# Patient Record
Sex: Female | Born: 1965 | Hispanic: No | Marital: Married | Smoking: Never smoker
Health system: Southern US, Community
[De-identification: ages and names within clinical notes are randomized; demographics above are authoritative.]

## PROBLEM LIST (undated history)

## (undated) DIAGNOSIS — Z1379 Encounter for other screening for genetic and chromosomal anomalies: Secondary | ICD-10-CM

## (undated) DIAGNOSIS — J45909 Unspecified asthma, uncomplicated: Secondary | ICD-10-CM

## (undated) DIAGNOSIS — T7840XA Allergy, unspecified, initial encounter: Secondary | ICD-10-CM

## (undated) DIAGNOSIS — Z803 Family history of malignant neoplasm of breast: Secondary | ICD-10-CM

## (undated) DIAGNOSIS — M1990 Unspecified osteoarthritis, unspecified site: Secondary | ICD-10-CM

## (undated) HISTORY — DX: Encounter for other screening for genetic and chromosomal anomalies: Z13.79

## (undated) HISTORY — PX: BREAST BIOPSY: SHX20

## (undated) HISTORY — DX: Unspecified osteoarthritis, unspecified site: M19.90

## (undated) HISTORY — DX: Allergy, unspecified, initial encounter: T78.40XA

## (undated) HISTORY — DX: Family history of malignant neoplasm of breast: Z80.3

## (undated) HISTORY — PX: TUBAL LIGATION: SHX77

## (undated) HISTORY — DX: Unspecified asthma, uncomplicated: J45.909

---

## 2005-11-15 HISTORY — PX: ABLATION: SHX5711

## 2017-09-05 DIAGNOSIS — Z9889 Other specified postprocedural states: Secondary | ICD-10-CM | POA: Insufficient documentation

## 2020-04-15 DIAGNOSIS — M79605 Pain in left leg: Secondary | ICD-10-CM | POA: Insufficient documentation

## 2020-04-15 DIAGNOSIS — G8929 Other chronic pain: Secondary | ICD-10-CM | POA: Insufficient documentation

## 2020-10-21 ENCOUNTER — Ambulatory Visit (INDEPENDENT_AMBULATORY_CARE_PROVIDER_SITE_OTHER): Payer: BLUE CROSS/BLUE SHIELD | Admitting: Advanced Practice Midwife

## 2020-10-21 ENCOUNTER — Encounter: Payer: Self-pay | Admitting: Advanced Practice Midwife

## 2020-10-21 ENCOUNTER — Other Ambulatory Visit: Payer: Self-pay

## 2020-10-21 ENCOUNTER — Other Ambulatory Visit (HOSPITAL_COMMUNITY)
Admission: RE | Admit: 2020-10-21 | Discharge: 2020-10-21 | Disposition: A | Discharge status: Home / Self Care | Payer: BLUE CROSS/BLUE SHIELD | Source: Ambulatory Visit | Attending: Advanced Practice Midwife | Admitting: Advanced Practice Midwife

## 2020-10-21 VITALS — BP 128/72 | Ht 68.0 in | Wt 171.2 lb

## 2020-10-21 DIAGNOSIS — Z01419 Encounter for gynecological examination (general) (routine) without abnormal findings: Secondary | ICD-10-CM

## 2020-10-21 DIAGNOSIS — Z0000 Encounter for general adult medical examination without abnormal findings: Secondary | ICD-10-CM

## 2020-10-21 DIAGNOSIS — Z1239 Encounter for other screening for malignant neoplasm of breast: Secondary | ICD-10-CM

## 2020-10-21 DIAGNOSIS — Z124 Encounter for screening for malignant neoplasm of cervix: Secondary | ICD-10-CM | POA: Insufficient documentation

## 2020-10-21 DIAGNOSIS — Z13220 Encounter for screening for lipoid disorders: Secondary | ICD-10-CM | POA: Diagnosis not present

## 2020-10-21 DIAGNOSIS — Z131 Encounter for screening for diabetes mellitus: Secondary | ICD-10-CM

## 2020-10-21 NOTE — Progress Notes (Signed)
Gynecology Annual Exam  PCP: Vickie [name], No Pcp Per  Chief Complaint:  Chief Complaint  Vickie [name] presents with  . Gynecologic Exam    History of Present Illness:Vickie [name] is a 54 y.o. G1P1001 presents for annual exam. The Vickie [name] has no complaints today. She has recently moved here from [place] and requests establishment of care. She has had regular gyn care prior.   LMP: No LMP recorded. Vickie [name] has had an ablation.  Postcoital Bleeding: no Dysmenorrhea: not applicable  The Vickie [name] is sexually active. She denies dyspareunia.  The Vickie [name] does perform self breast exams.  There is notable family history of breast or ovarian cancer in her family. Her mother, maternal sister and maternal grandmother had breast cancer. She has had genetic testing and is not sure of the results. She will sign release for information.   The Vickie [name] wears seatbelts: yes.   The Vickie [name] has regular exercise: She walks daily and works out in [employer] She eats a healthy diet, has adequate hydration and adequate sleep.    The Vickie [name] denies current symptoms of depression.     Review of Systems: Review of Systems  Constitutional: Negative for chills and fever.  HENT: Negative for congestion, ear discharge, ear pain, hearing loss, sinus pain and sore throat.   Eyes: Negative for blurred vision and double vision.  Respiratory: Negative for cough, shortness of breath and wheezing.   Cardiovascular: Negative for chest pain, palpitations and leg swelling.  Gastrointestinal: Negative for abdominal pain, blood in stool, constipation, diarrhea, heartburn, melena, nausea and vomiting.  Genitourinary: Negative for dysuria, flank pain, frequency, hematuria and urgency.  Musculoskeletal: Negative for back pain, joint pain and myalgias.  Skin: Negative for itching and rash.  Neurological: Negative for dizziness, tingling, tremors, sensory change, speech change, focal weakness, seizures, loss of consciousness, weakness and  headaches.  Endo/Heme/Allergies: Negative for environmental allergies. Does not bruise/bleed easily.  Psychiatric/Behavioral: Negative for depression, hallucinations, memory loss, substance abuse and suicidal ideas. The Vickie [name] is not nervous/anxious and does not have insomnia.     Past Medical History:  There are no problems to display for this Vickie [name].   Past Surgical History:  Past Surgical History:  Procedure Laterality Date  . ABLATION Bilateral 2007  . CESAREAN SECTION    . TUBAL LIGATION      Gynecologic History:  No LMP recorded. Vickie [name] has had an ablation. Last Pap: 1 year ago Results were:  no abnormalities per Vickie [name] report, new partner in past year Last mammogram: 1 year ago Results were: BI-RAD I per Vickie [name] report  Obstetric History: G1P1001  Family History:  Family History  Problem Relation Age of Onset  . Breast cancer Mother     Social History:  Social History   Socioeconomic History  . Marital status: Single    Spouse name: Not on file  . Number of children: Not on file  . Years of education: Not on file  . Highest education level: Not on file  Occupational History  . Not on file  Tobacco Use  . Smoking status: Never Smoker  . Smokeless tobacco: Never Used  Vaping Use  . Vaping Use: Never assessed  Substance and Sexual Activity  . Alcohol use: Yes    Comment: occ  . Drug use: Never  . Sexual activity: Yes    Birth control/protection: None  Other Topics Concern  . Not on file  Social History Narrative  . Not on file   Social Determinants of Health  Financial Resource Strain:   . Difficulty of Paying Living Expenses: Not on file  Food Insecurity:   . Worried About Programme researcher, broadcasting/film/video in the Last Year: Not on file  . Ran Out of Food in the Last Year: Not on file  Transportation Needs:   . Lack of Transportation (Medical): Not on file  . Lack of Transportation (Non-Medical): Not on file  Physical Activity:   . Days of Exercise per  Week: Not on file  . Minutes of Exercise per Session: Not on file  Stress:   . Feeling of Stress : Not on file  Social Connections:   . Frequency of Communication with Friends and Family: Not on file  . Frequency of Social Gatherings with Friends and Family: Not on file  . Attends Religious Services: Not on file  . Active Member of Clubs or Organizations: Not on file  . Attends Banker Meetings: Not on file  . Marital Status: Not on file  Intimate Partner Violence:   . Fear of Current or Ex-Partner: Not on file  . Emotionally Abused: Not on file  . Physically Abused: Not on file  . Sexually Abused: Not on file    Allergies:  Not on File  Medications: Prior to Admission medications   Not on File    Physical Exam Vitals: Blood pressure 128/72, height 5\' 8"  (1.727 m), weight 171 lb 3.2 oz (77.7 kg).  General: NAD HEENT: normocephalic, anicteric Thyroid: no enlargement, no palpable nodules Pulmonary: No increased work of breathing, CTAB Cardiovascular: RRR, distal pulses 2+ Breast: Breast symmetrical, no tenderness, no palpable nodules or masses, no skin or nipple retraction present, no nipple discharge.  No axillary or supraclavicular lymphadenopathy. Abdomen: NABS, soft, non-tender, non-distended.  Umbilicus without lesions.  No hepatomegaly, splenomegaly or masses palpable. No evidence of hernia  Genitourinary:  External: Normal external female genitalia.  Normal urethral meatus, normal Bartholin's and Skene's glands.    Vagina: Normal vaginal mucosa, no evidence of prolapse.    Cervix: Grossly normal in appearance, no bleeding, no CMT  Uterus: Non-enlarged, mobile, normal contour.    Adnexa: ovaries non-enlarged, no adnexal masses  Rectal: deferred  Lymphatic: no evidence of inguinal lymphadenopathy Extremities: no edema, erythema, or tenderness Neurologic: Grossly intact Psychiatric: mood appropriate, affect full    Assessment: 54 y.o. G1P1001 routine  annual exam  Plan: Problem List Items Addressed This Visit    None    Visit Diagnoses    Well woman exam with routine gynecological exam    -  Primary   Relevant Orders   Hgb A1c w/o eAG   Lipid Panel With LDL/HDL Ratio   CBC   Comprehensive metabolic panel   Cytology - PAP   MM DIGITAL SCREENING BILATERAL   Cervical cancer screening       Relevant Orders   Cytology - PAP   Blood tests for routine general physical examination       Relevant Orders   Hgb A1c w/o eAG   Lipid Panel With LDL/HDL Ratio   CBC   Comprehensive metabolic panel   Screening cholesterol level       Relevant Orders   Lipid Panel With LDL/HDL Ratio   Screening for diabetes mellitus       Relevant Orders   Hgb A1c w/o eAG   Breast screening       Relevant Orders   MM DIGITAL SCREENING BILATERAL      1) Mammogram - recommend yearly screening mammogram.  Mammogram Was ordered today  2) STI screening  was offered and declined  3) ASCCP guidelines and rationale discussed.  Vickie [name] opts for every 3 years screening interval if today's result is normal.  4) Osteoporosis  - per USPTF routine screening DEXA at age 56  Consider FDA-approved medical therapies in postmenopausal women and men aged 29 years and older, based on the following: a) A hip or vertebral (clinical or morphometric) fracture b) T-score ? -2.5 at the femoral neck or spine after appropriate evaluation to exclude secondary causes C) Low bone mass (T-score between -1.0 and -2.5 at the femoral neck or spine) and a 10-year probability of a hip fracture ? 3% or a 10-year probability of a major osteoporosis-related fracture ? 20% based on the [place]-adapted WHO algorithm   5) Routine healthcare maintenance including cholesterol, diabetes screening discussed Ordered today  6) Colonoscopy is up to date and next due in 2024.  Screening recommended starting at age 65 for average risk individuals, age 62 for individuals deemed at increased risk  (including African Americans) and recommended to continue until age 30.  For Vickie [name] age 75-85 individualized approach is recommended.  Gold standard screening is via colonoscopy, Cologuard screening is an acceptable alternative for Vickie [name] unwilling or unable to undergo colonoscopy.  "Colorectal cancer screening for average?risk adults: 2018 guideline update from the American Cancer Society"CA: A Cancer Journal for Clinicians: Apr 13, 2017   7) No follow-ups on file.    Vena Austria, MD Domingo Pulse, [facility] 10/21/2020, 11:43 AM

## 2020-10-21 NOTE — Progress Notes (Signed)
NEW Patient

## 2020-10-22 LAB — COMPREHENSIVE METABOLIC PANEL
ALT: 23 IU/L (ref 0–32)
AST: 27 IU/L (ref 0–40)
Albumin/Globulin Ratio: 1.3 (ref 1.2–2.2)
Albumin: 4.3 g/dL (ref 3.8–4.9)
Alkaline Phosphatase: 76 IU/L (ref 44–121)
BUN/Creatinine Ratio: 16 (ref 9–23)
BUN: 12 mg/dL (ref 6–24)
Bilirubin Total: 0.4 mg/dL (ref 0.0–1.2)
CO2: 24 mmol/L (ref 20–29)
Calcium: 9.8 mg/dL (ref 8.7–10.2)
Chloride: 101 mmol/L (ref 96–106)
Creatinine, Ser: 0.75 mg/dL (ref 0.57–1.00)
GFR calc Af Amer: 106 mL/min/{1.73_m2} (ref >59)
GFR calc non Af Amer: 92 mL/min/{1.73_m2} (ref >59)
Globulin, Total: 3.2 g/dL (ref 1.5–4.5)
Glucose: 85 mg/dL (ref 65–99)
Potassium: 4.4 mmol/L (ref 3.5–5.2)
Sodium: 140 mmol/L (ref 134–144)
Total Protein: 7.5 g/dL (ref 6.0–8.5)

## 2020-10-22 LAB — HGB A1C W/O EAG: Hgb A1c MFr Bld: 5.2 % (ref 4.8–5.6)

## 2020-10-22 LAB — LIPID PANEL WITH LDL/HDL RATIO
Cholesterol, Total: 236 mg/dL — ABNORMAL HIGH (ref 100–199)
HDL: 87 mg/dL (ref >39)
LDL Chol Calc (NIH): 134 mg/dL — ABNORMAL HIGH (ref 0–99)
LDL/HDL Ratio: 1.5 ratio (ref 0.0–3.2)
Triglycerides: 86 mg/dL (ref 0–149)
VLDL Cholesterol Cal: 15 mg/dL (ref 5–40)

## 2020-10-22 LAB — CBC
Hematocrit: 38 % (ref 34.0–46.6)
Hemoglobin: 12.7 g/dL (ref 11.1–15.9)
MCH: 29.5 pg (ref 26.6–33.0)
MCHC: 33.4 g/dL (ref 31.5–35.7)
MCV: 88 fL (ref 79–97)
Platelets: 293 10*3/uL (ref 150–450)
RBC: 4.3 x10E6/uL (ref 3.77–5.28)
RDW: 13.1 % (ref 11.7–15.4)
WBC: 8.1 10*3/uL (ref 3.4–10.8)

## 2020-10-23 LAB — CYTOLOGY - PAP
Comment: NEGATIVE
Diagnosis: NEGATIVE
High risk HPV: NEGATIVE

## 2020-10-28 ENCOUNTER — Other Ambulatory Visit: Payer: Self-pay | Admitting: Advanced Practice Midwife

## 2020-10-28 DIAGNOSIS — Z1231 Encounter for screening mammogram for malignant neoplasm of breast: Secondary | ICD-10-CM

## 2020-10-29 ENCOUNTER — Encounter: Payer: Self-pay | Admitting: Obstetrics and Gynecology

## 2021-06-29 ENCOUNTER — Ambulatory Visit
Admission: RE | Admit: 2021-06-29 | Discharge: 2021-06-29 | Disposition: A | Discharge status: Home / Self Care | Payer: BC Managed Care – PPO | Source: Ambulatory Visit | Attending: Advanced Practice Midwife | Admitting: Advanced Practice Midwife

## 2021-06-29 ENCOUNTER — Other Ambulatory Visit (INDEPENDENT_AMBULATORY_CARE_PROVIDER_SITE_OTHER): Payer: Self-pay | Admitting: Nurse Practitioner

## 2021-06-29 ENCOUNTER — Other Ambulatory Visit: Payer: Self-pay

## 2021-06-29 DIAGNOSIS — I83813 Varicose veins of bilateral lower extremities with pain: Secondary | ICD-10-CM

## 2021-06-29 DIAGNOSIS — Z1231 Encounter for screening mammogram for malignant neoplasm of breast: Secondary | ICD-10-CM | POA: Insufficient documentation

## 2021-06-30 ENCOUNTER — Other Ambulatory Visit: Payer: Self-pay | Admitting: Advanced Practice Midwife

## 2021-06-30 DIAGNOSIS — M79621 Pain in right upper arm: Secondary | ICD-10-CM

## 2021-07-01 ENCOUNTER — Ambulatory Visit (INDEPENDENT_AMBULATORY_CARE_PROVIDER_SITE_OTHER): Payer: BC Managed Care – PPO | Admitting: Nurse Practitioner

## 2021-07-01 ENCOUNTER — Ambulatory Visit (INDEPENDENT_AMBULATORY_CARE_PROVIDER_SITE_OTHER): Payer: BC Managed Care – PPO

## 2021-07-01 ENCOUNTER — Other Ambulatory Visit: Payer: Self-pay

## 2021-07-01 ENCOUNTER — Encounter (INDEPENDENT_AMBULATORY_CARE_PROVIDER_SITE_OTHER): Payer: Self-pay | Admitting: Nurse Practitioner

## 2021-07-01 VITALS — BP 139/72 | HR 71 | Resp 16 | Ht 68.0 in | Wt 168.0 lb

## 2021-07-01 DIAGNOSIS — I83813 Varicose veins of bilateral lower extremities with pain: Secondary | ICD-10-CM

## 2021-07-01 DIAGNOSIS — I83819 Varicose veins of unspecified lower extremities with pain: Secondary | ICD-10-CM | POA: Diagnosis not present

## 2021-07-01 NOTE — Progress Notes (Signed)
Subjective:    Patient ID: Vickie [name], female    DOB: 30-Jan-1966, 55 y.o.   MRN: [id number] Chief Complaint  Patient presents with   New Patient (Initial Visit)    Ref Ardyth Gal varicose veins with pain    Vickie [name] is a 55 year old female that patient is seen for evaluation of symptomatic varicose veins.  Initially she was evaluated by [facility] in [place].  Prior to her being able to undergo intervention she lost her insurance and once she switched insurances she was out of network with [facility].  Since that time the patient has been utilizing medical grade compression socks on a daily basis however these have not been helpful in controlling her pain.  She has not had any other previous endovascular interventions or sclerotherapy.  There has been use of over-the-counter analgesics.  The patient relates burning and stinging which worsened steadily throughout the course of the day, particularly with standing. The patient also notes an aching and throbbing pain over the varicosities, particularly with prolonged dependent positions. The symptoms are significantly improved with elevation.  The patient also notes that during hot weather the symptoms are greatly intensified. The patient states the pain from the varicose veins interferes with work, daily exercise, shopping and household maintenance. At this point, the symptoms are persistent and severe enough that they're having a negative impact on lifestyle and are interfering with daily activities.  There is no history of DVT, PE or superficial thrombophlebitis. There is no history of ulceration or hemorrhage.  Today noninvasive studies show the right leg has evidence of reflux in the great saphenous vein at the saphenofemoral junction as well as at the distal thigh and knee.  The left lower extremity has evidence of reflux in the great saphenous vein at the mid thigh.  The patient has no evidence of DVT or superficial  thrombophlebitis noted bilaterally.  No evidence of deep venous insufficiency noted.   Review of Systems  Cardiovascular:        Vein tenderness   All other systems reviewed and are negative.     Objective:   Physical Exam Vitals reviewed.  HENT:     Head: Normocephalic.  Cardiovascular:     Rate and Rhythm: Normal rate.  Pulmonary:     Effort: Pulmonary effort is normal.  Musculoskeletal:        General: Tenderness present.     Right lower leg: Edema present.     Left lower leg: Edema present.  Skin:    Comments: Scattered varicosities bilaterally   Neurological:     Mental Status: She is alert and oriented to person, place, and time.  Psychiatric:        Mood and Affect: Mood normal.        Behavior: Behavior normal.        Thought Content: Thought content normal.        Judgment: Judgment normal.    BP 139/72 (BP Location: Right Arm)   Pulse 71   Resp 16   Ht 5\' 8"  (1.727 m)   Wt 168 lb (76.2 kg)   BMI 25.54 kg/m   Past Medical History:  Diagnosis Date   Family history of breast cancer    Genetic testing of female    pt had done in past, records request sent    Social History   Socioeconomic History   Marital status: Married    Spouse name: Not on file   Number of children: Not on  file   Years of education: Not on file   Highest education level: Not on file  Occupational History   Not on file  Tobacco Use   Smoking status: Never   Smokeless tobacco: Never  Vaping Use   Vaping Use: Not on file  Substance and Sexual Activity   Alcohol use: Yes    Comment: occ   Drug use: Never   Sexual activity: Yes    Birth control/protection: None  Other Topics Concern   Not on file  Social History Narrative   Not on file   Social Determinants of Health   Financial Resource Strain: Not on file  Food Insecurity: Not on file  Transportation Needs: Not on file  Physical Activity: Not on file  Stress: Not on file  Social Connections: Not on file   Intimate Partner Violence: Not on file    Past Surgical History:  Procedure Laterality Date   ABLATION Bilateral 2007   BREAST BIOPSY Left    benign   BREAST BIOPSY Left    benign   CESAREAN SECTION     TUBAL LIGATION      Family History  Problem Relation Age of Onset   Breast cancer Mother    Breast cancer Sister    Breast cancer Maternal Grandmother    Breast cancer Other     No Known Allergies  CBC Latest Ref Rng & Units 10/21/2020  WBC 3.4 - 10.8 x10E3/uL 8.1  Hemoglobin 11.1 - 15.9 g/dL 41.9  Hematocrit 37.9 - 46.6 % 38.0  Platelets 150 - 450 x10E3/uL 293      CMP     Component Value Date/Time   NA 140 10/21/2020 1037   K 4.4 10/21/2020 1037   CL 101 10/21/2020 1037   CO2 24 10/21/2020 1037   GLUCOSE 85 10/21/2020 1037   BUN 12 10/21/2020 1037   CREATININE 0.75 10/21/2020 1037   CALCIUM 9.8 10/21/2020 1037   PROT 7.5 10/21/2020 1037   ALBUMIN 4.3 10/21/2020 1037   AST 27 10/21/2020 1037   ALT 23 10/21/2020 1037   ALKPHOS 76 10/21/2020 1037   BILITOT 0.4 10/21/2020 1037   GFRNONAA 92 10/21/2020 1037   GFRAA 106 10/21/2020 1037     No results found.     Assessment & Plan:   1. Varicose veins with pain Recommend  I have reviewed my discussion with the patient regarding  varicose veins and why they cause symptoms. Patient will continue  wearing graduated compression stockings class 1 on a daily basis, beginning first thing in the morning and removing them in the evening.    In addition, behavioral modification including elevation during the day was again discussed and this will continue.  The patient has utilized over the counter pain medications and has been exercising.  However, at this time conservative therapy has not alleviated the patient's symptoms of leg pain and swelling  Recommend: laser ablation of the left followed by right great saphenous veins to eliminate the symptoms of pain and swelling of the lower extremities caused by the  severe superficial venous reflux disease.     No current outpatient medications on file prior to visit.   No current facility-administered medications on file prior to visit.    There are no Patient Instructions on file for this visit. No follow-ups on file.   Georgiana Spinner, NP

## 2021-07-06 ENCOUNTER — Ambulatory Visit
Admission: RE | Admit: 2021-07-06 | Discharge: 2021-07-06 | Disposition: A | Discharge status: Home / Self Care | Payer: BC Managed Care – PPO | Source: Ambulatory Visit | Attending: Advanced Practice Midwife | Admitting: Advanced Practice Midwife

## 2021-07-06 ENCOUNTER — Other Ambulatory Visit: Payer: Self-pay | Admitting: Advanced Practice Midwife

## 2021-07-06 ENCOUNTER — Other Ambulatory Visit: Payer: Self-pay

## 2021-07-06 DIAGNOSIS — M79621 Pain in right upper arm: Secondary | ICD-10-CM | POA: Diagnosis not present

## 2021-07-06 DIAGNOSIS — R922 Inconclusive mammogram: Secondary | ICD-10-CM | POA: Diagnosis not present

## 2021-07-06 DIAGNOSIS — Z803 Family history of malignant neoplasm of breast: Secondary | ICD-10-CM | POA: Diagnosis not present

## 2021-07-06 DIAGNOSIS — R928 Other abnormal and inconclusive findings on diagnostic imaging of breast: Secondary | ICD-10-CM

## 2021-07-06 IMAGING — US US BREAST*R* LIMITED INC AXILLA
1 series · 9 of 9 positions shown · non-contrast
Comparison: Previous exam(s).

CLINICAL DATA: Patient has a strong family history of breast cancer
in her mother, sister and maternal grandmother. She complains of a
palpable abnormality in her right axilla.

EXAM:
DIGITAL DIAGNOSTIC BILATERAL MAMMOGRAM WITH TOMOSYNTHESIS AND CAD;
ULTRASOUND RIGHT BREAST LIMITED
TECHNIQUE: Bilateral digital diagnostic mammography and breast tomosynthesis
was performed. The images were evaluated with computer-aided
detection.; Targeted ultrasound examination of the right breast was
performed

[Series 1: us breast*right* limited inc axilla · 0.06mm/px · 9 of 9 slices shown]
[im 1/9]
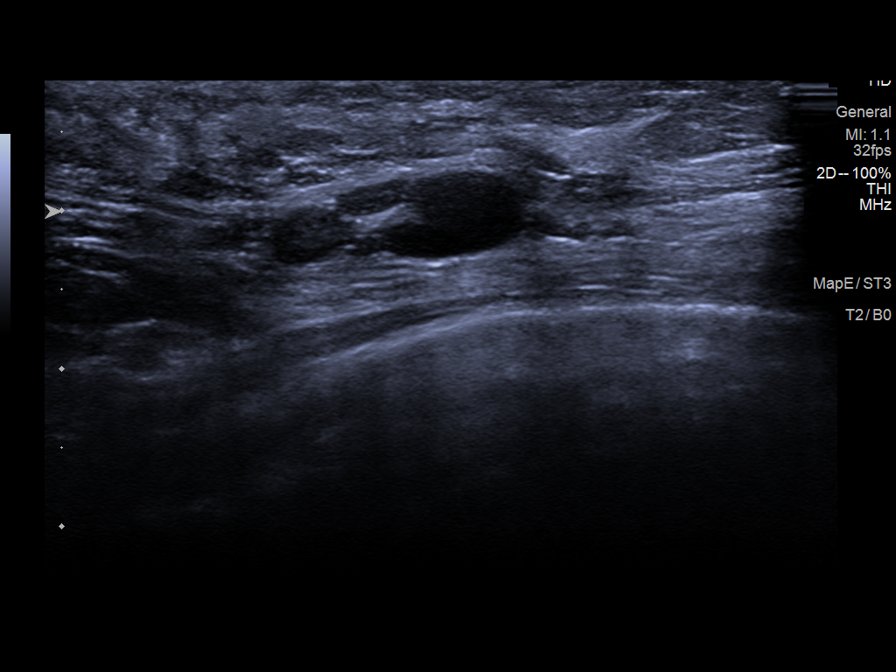
[im 2/9]
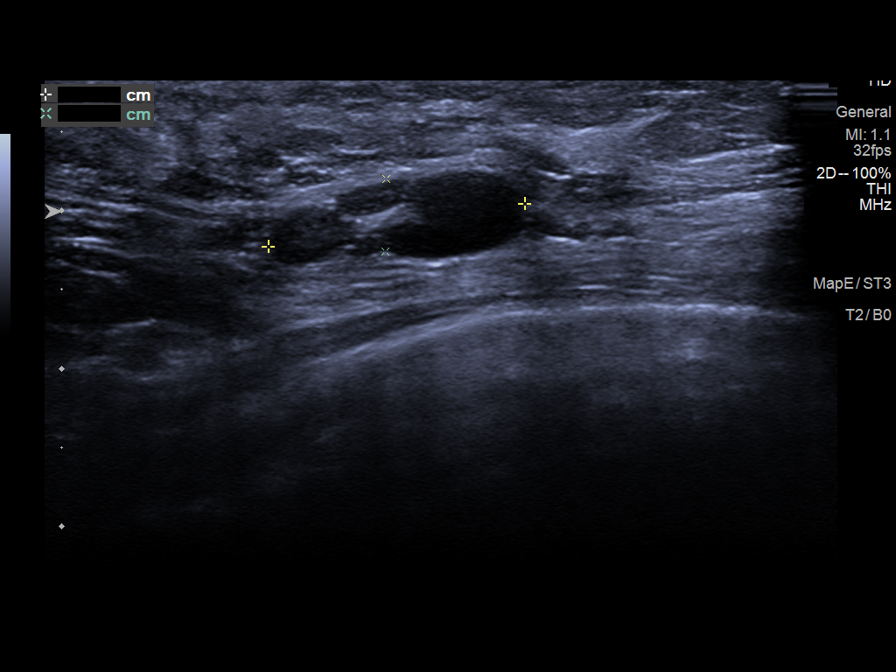
[im 3/9]
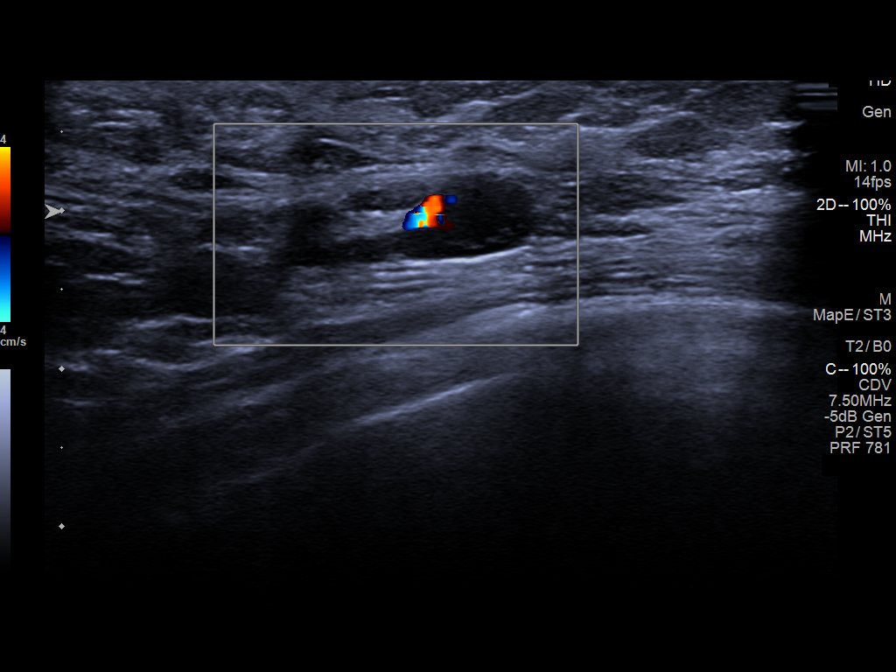
[im 4/9]
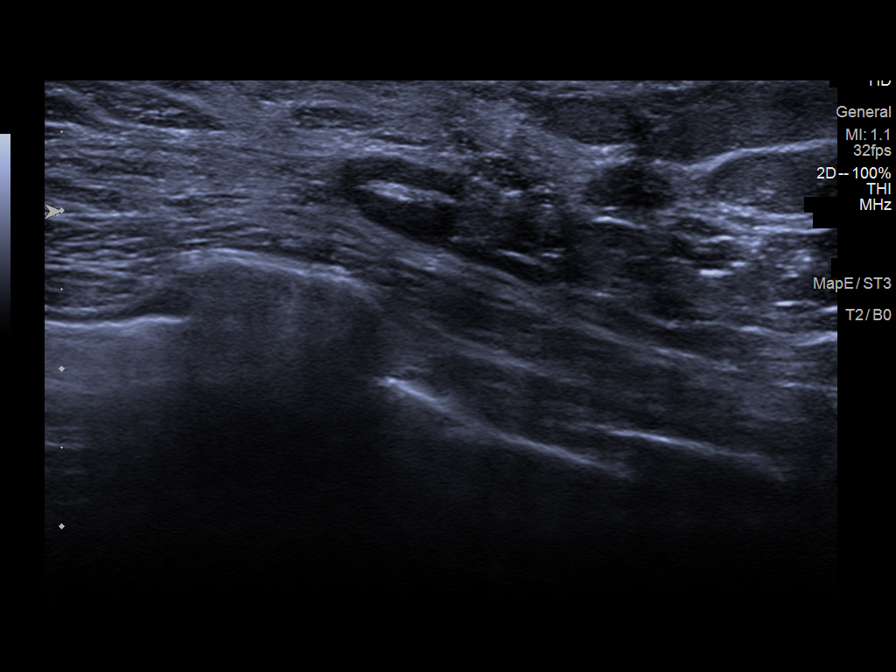
[im 5/9]
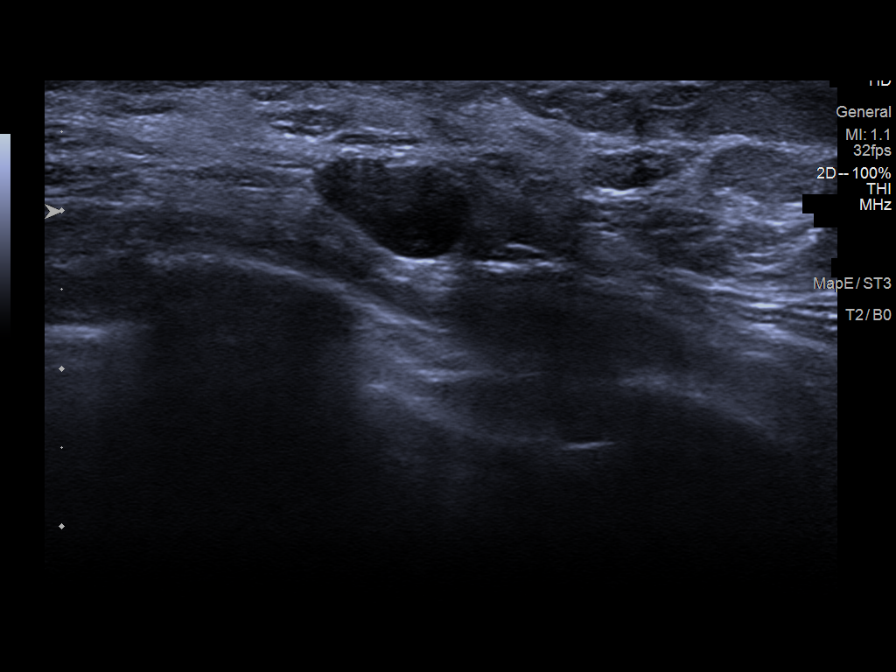
[im 6/9]
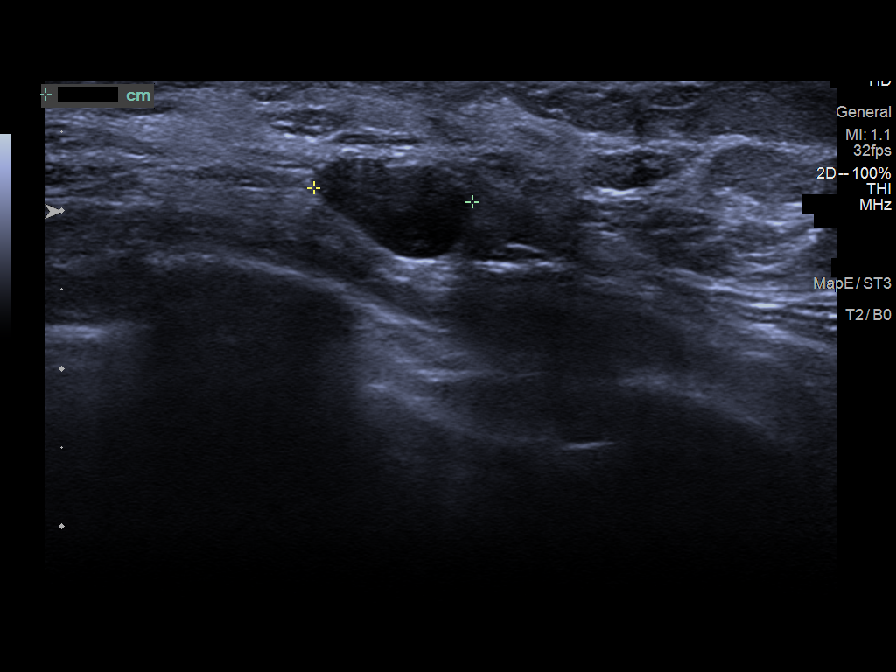
[im 7/9]
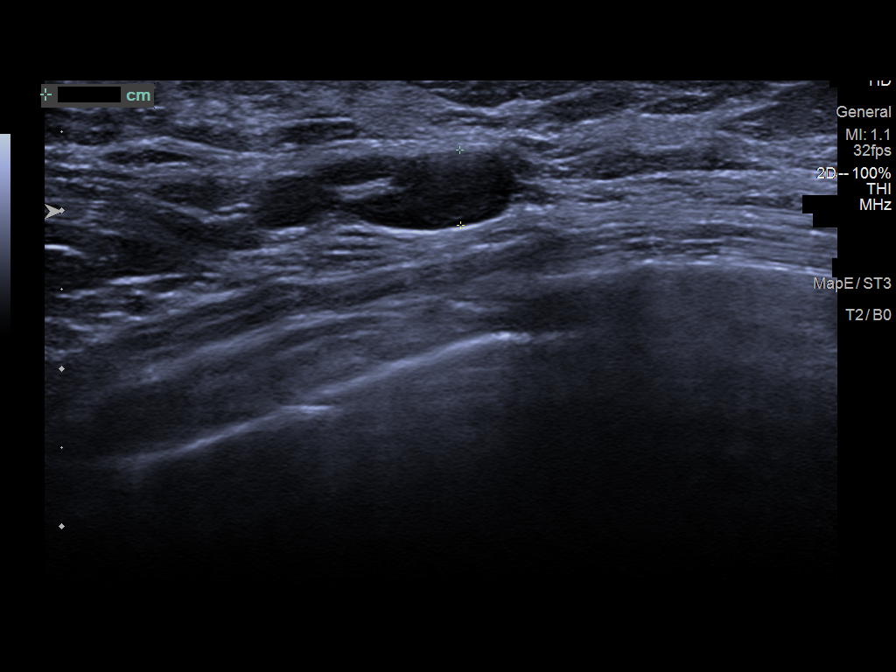
[im 8/9]
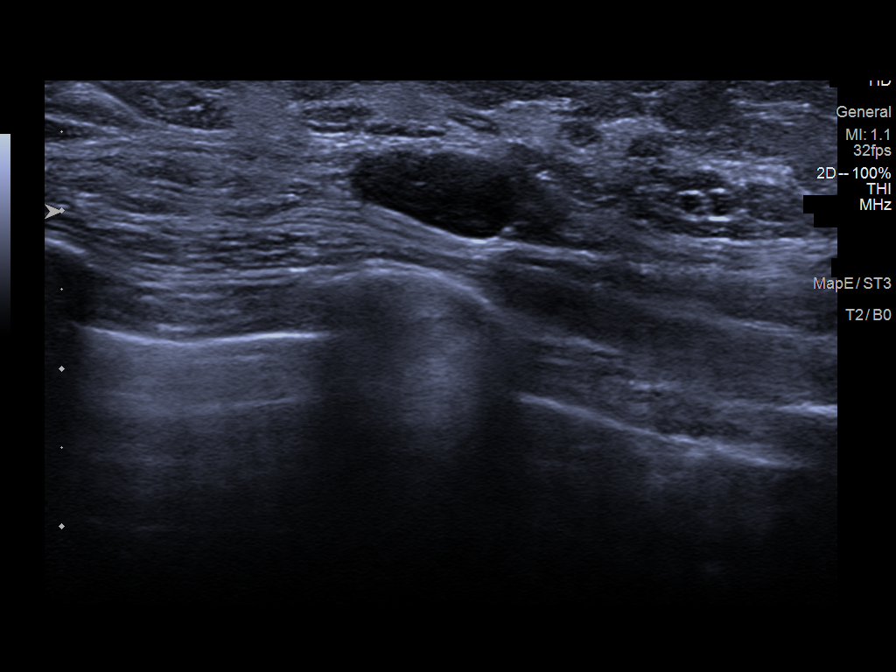
[im 9/9]
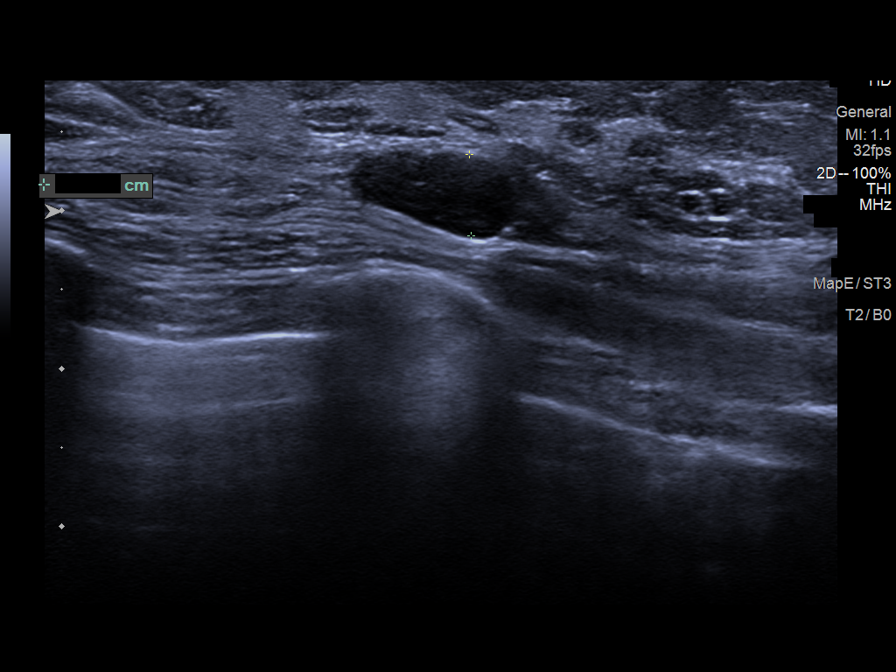

[9 of 9 positions shown; findings below may reference images not displayed]

ACR Breast Density Category c: The breast tissue is heterogeneously
dense, which may obscure small masses.
FINDINGS: No suspicious mass, malignant type microcalcifications or distortion
detected in either breast.

On physical exam, I palpate a discrete nodule in the right axilla.

Targeted ultrasound is performed, showing a lymph node with focal
cortical thickening measuring up to 5 mm.
IMPRESSION: Indeterminate right axillary lymph node.

RECOMMENDATION:
Ultrasound-guided core biopsy of the right axillary lymph node with
cortical thickening is recommended.

I have discussed the findings and recommendations with the patient.
If applicable, a reminder letter will be sent to the patient
regarding the next appointment.

BI-RADS CATEGORY  4: Suspicious.

## 2021-07-06 IMAGING — MG DIGITAL DIAGNOSTIC BILAT W/ TOMO W/ CAD
8 of 15 series · 8 of 40 positions shown · non-contrast
Comparison: Previous exam(s).

CLINICAL DATA: Patient has a strong family history of breast cancer
in her mother, sister and maternal grandmother. She complains of a
palpable abnormality in her right axilla.

EXAM:
DIGITAL DIAGNOSTIC BILATERAL MAMMOGRAM WITH TOMOSYNTHESIS AND CAD;
ULTRASOUND RIGHT BREAST LIMITED
TECHNIQUE: Bilateral digital diagnostic mammography and breast tomosynthesis
was performed. The images were evaluated with computer-aided
detection.; Targeted ultrasound examination of the right breast was
performed

[R CC synth-2D]
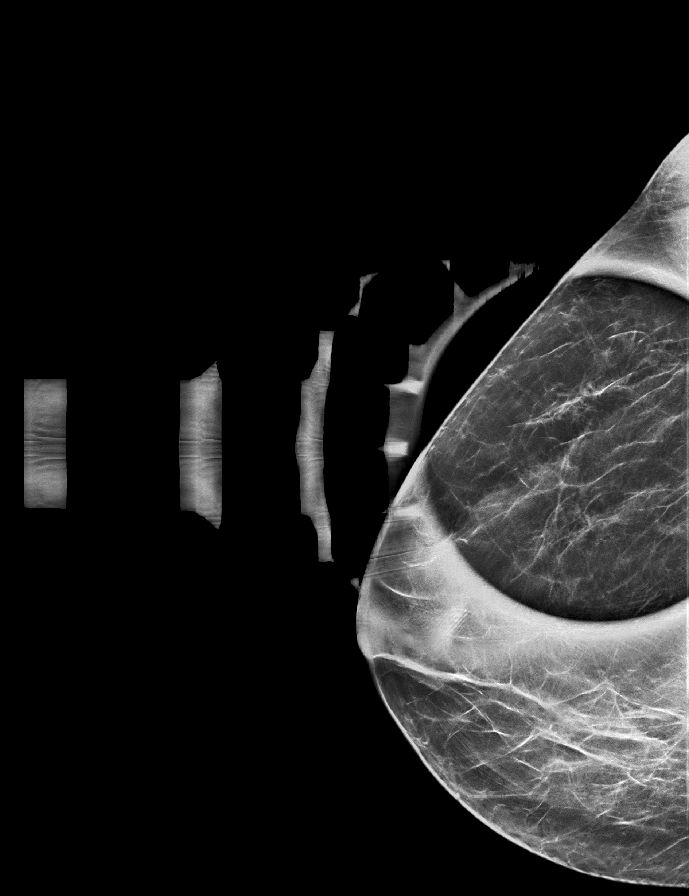

[L CC synth-2D]
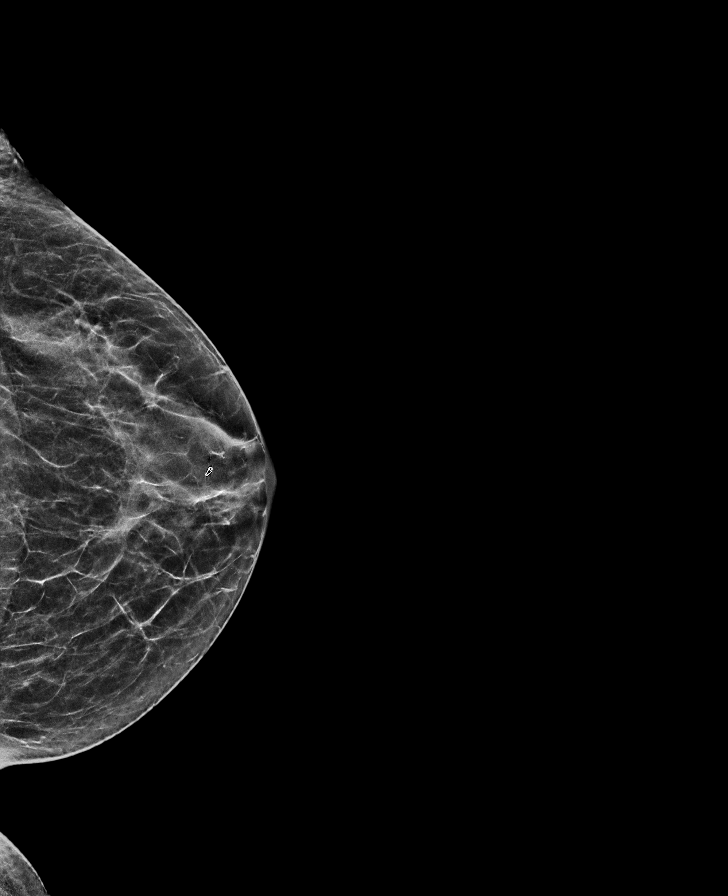

[R TAN synth-2D]
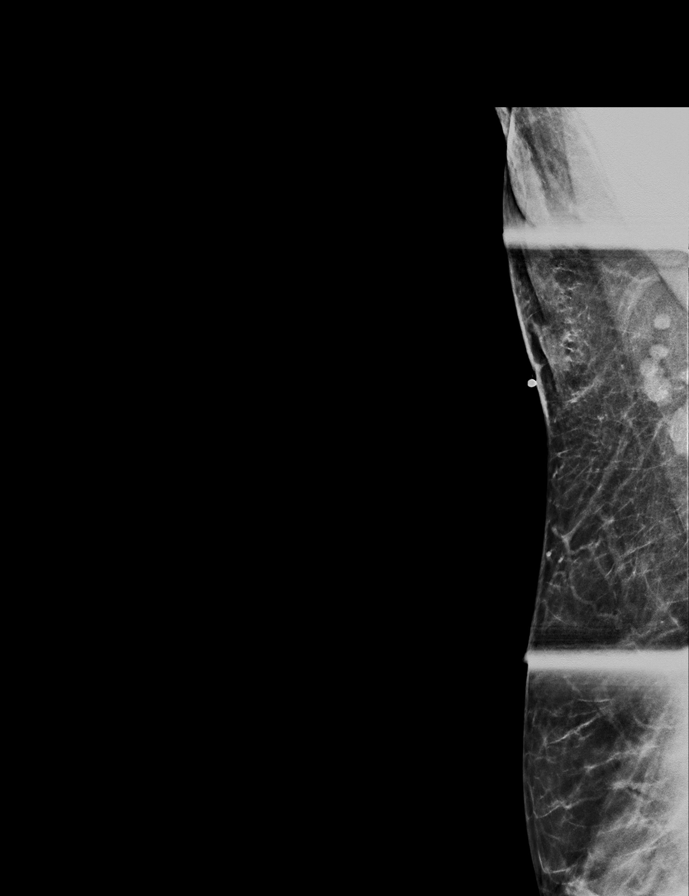

[R ML synth-2D (1 of 2)]
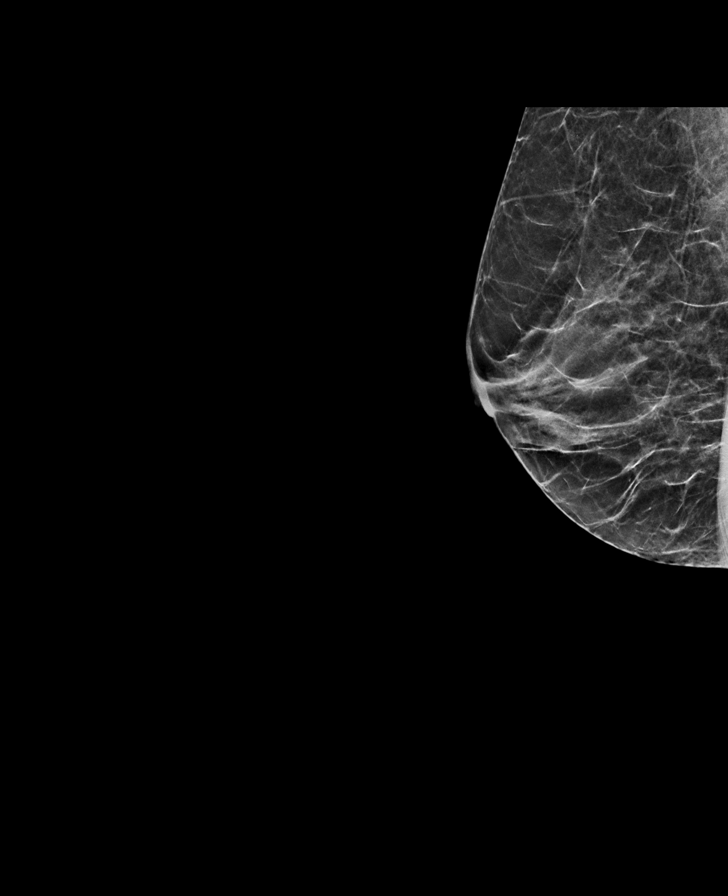

[R ML synth-2D (2 of 2)]
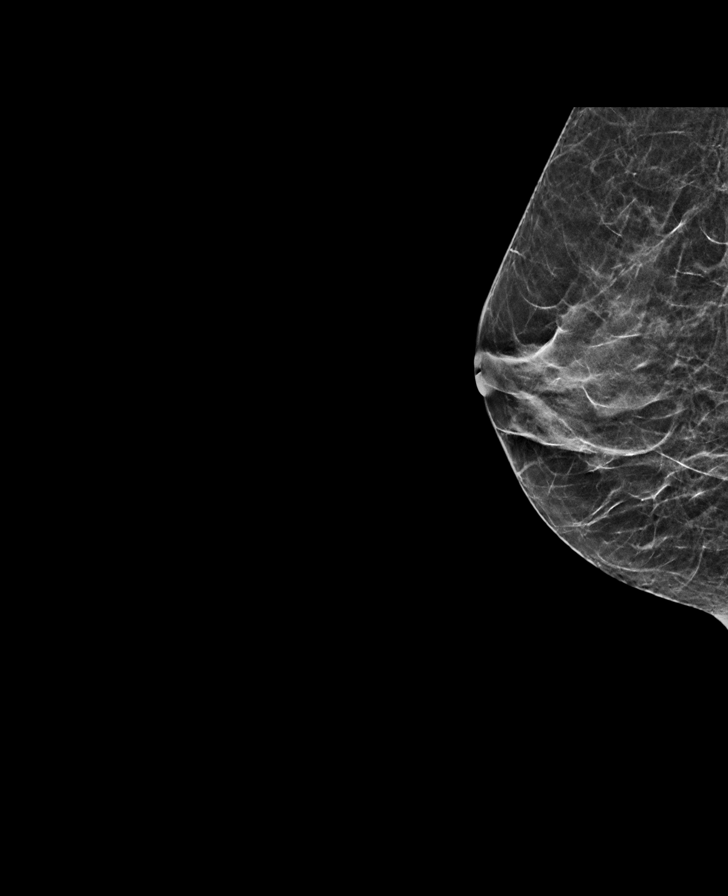

[L MLO synth-2D]
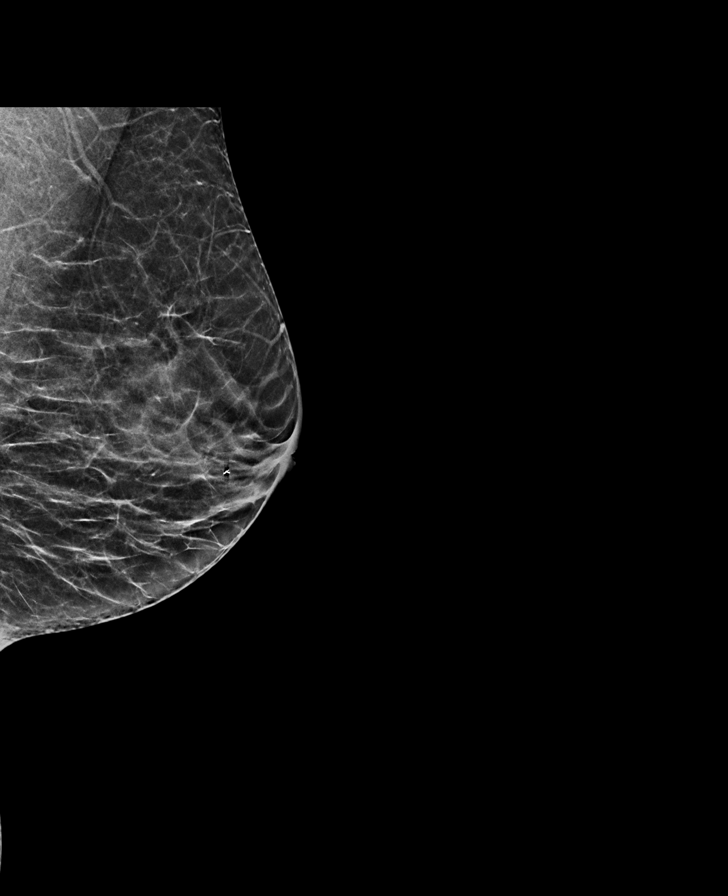

[R MLO synth-2D]
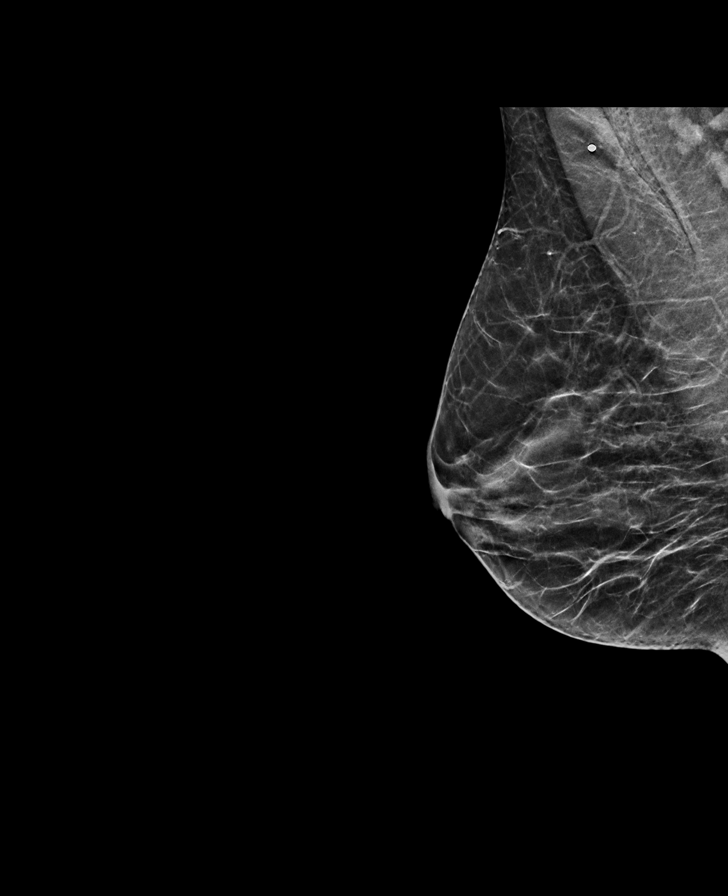

[R ML tomo · tomo slice 31/45.0]
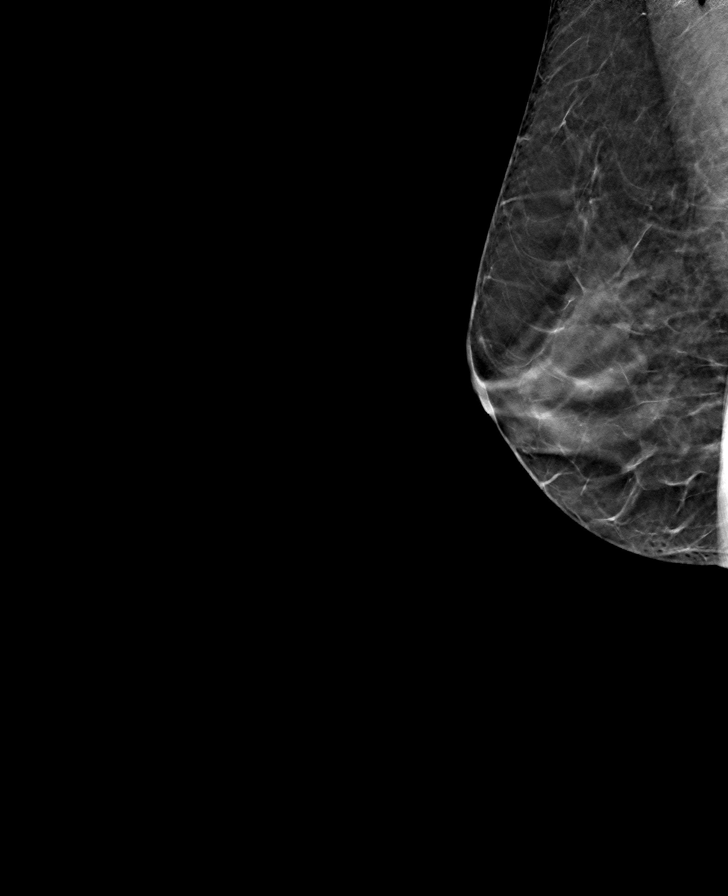

[8 of 40 positions shown; findings below may reference images not displayed]

ACR Breast Density Category c: The breast tissue is heterogeneously
dense, which may obscure small masses.
FINDINGS: No suspicious mass, malignant type microcalcifications or distortion
detected in either breast.

On physical exam, I palpate a discrete nodule in the right axilla.

Targeted ultrasound is performed, showing a lymph node with focal
cortical thickening measuring up to 5 mm.
IMPRESSION: Indeterminate right axillary lymph node.

RECOMMENDATION:
Ultrasound-guided core biopsy of the right axillary lymph node with
cortical thickening is recommended.

I have discussed the findings and recommendations with the patient.
If applicable, a reminder letter will be sent to the patient
regarding the next appointment.

BI-RADS CATEGORY  4: Suspicious.

## 2021-07-13 ENCOUNTER — Other Ambulatory Visit: Payer: Self-pay

## 2021-07-13 ENCOUNTER — Ambulatory Visit
Admission: RE | Admit: 2021-07-13 | Discharge: 2021-07-13 | Disposition: A | Discharge status: Home / Self Care | Payer: BC Managed Care – PPO | Source: Ambulatory Visit | Attending: Advanced Practice Midwife | Admitting: Advanced Practice Midwife

## 2021-07-13 DIAGNOSIS — R928 Other abnormal and inconclusive findings on diagnostic imaging of breast: Secondary | ICD-10-CM | POA: Insufficient documentation

## 2021-07-13 DIAGNOSIS — R590 Localized enlarged lymph nodes: Secondary | ICD-10-CM | POA: Diagnosis not present

## 2021-07-13 HISTORY — PX: BREAST BIOPSY: SHX20

## 2021-07-13 IMAGING — MG MM BREAST LOCALIZATION CLIP
6 series · 6 of 18 positions shown · non-contrast
Comparison: Previous exam(s).

CLINICAL DATA: Postprocedure mammogram

EXAM:
3D DIAGNOSTIC RIGHT MAMMOGRAM POST ULTRASOUND BIOPSY

[R MLO synth-2D (1 of 3)]
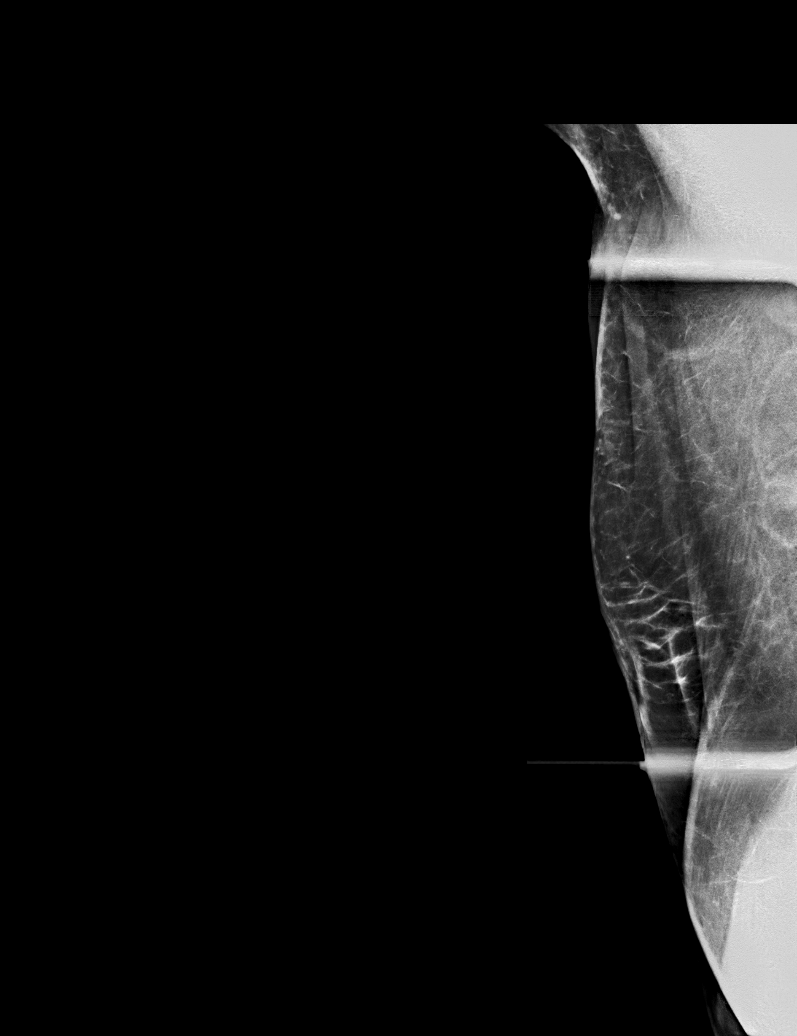

[R MLO synth-2D (2 of 3)]
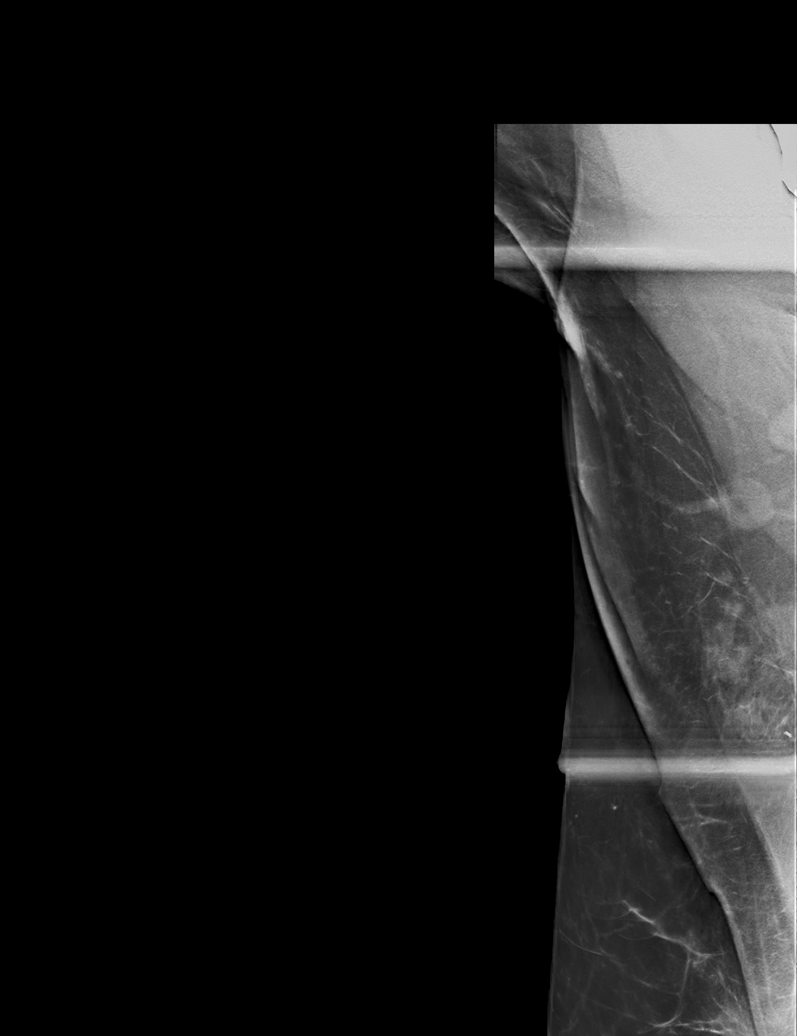

[R MLO synth-2D (3 of 3)]
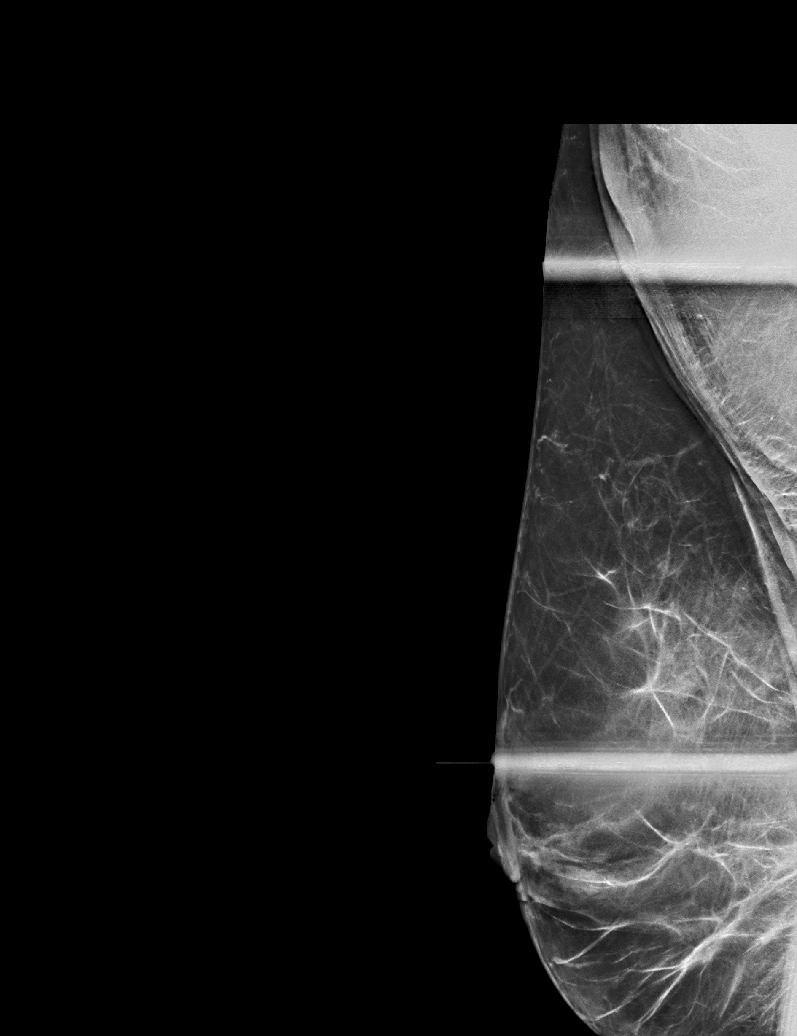

[R MLO tomo (1 of 3) · tomo slice 41/82.0]
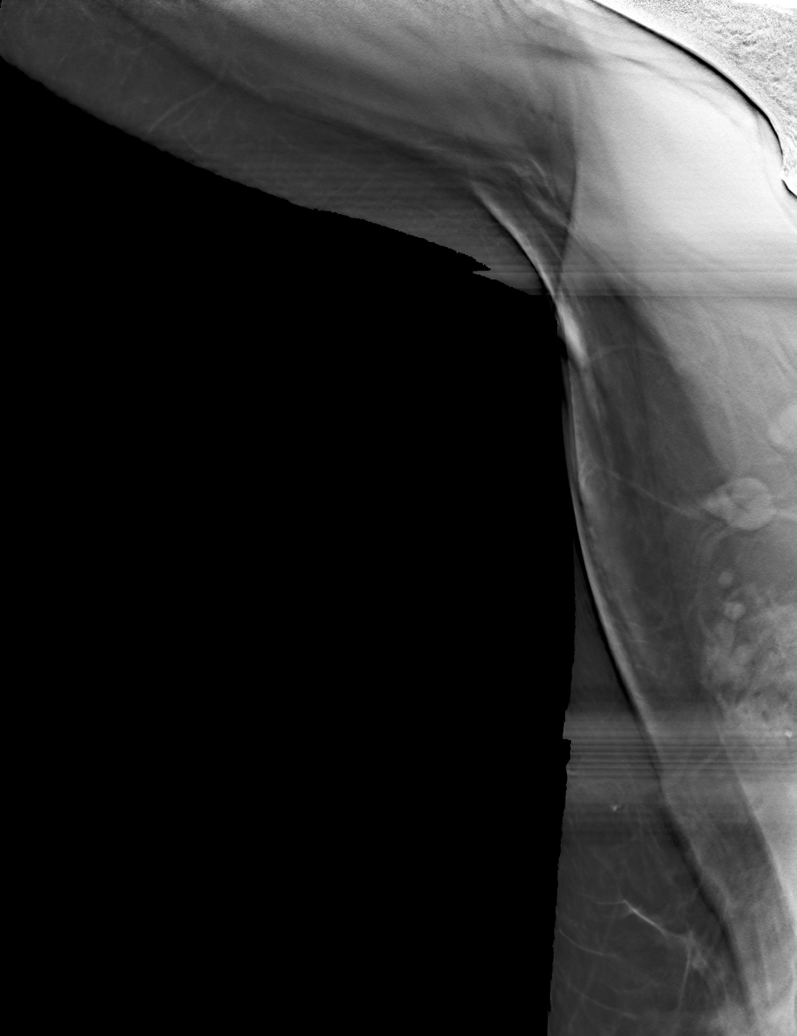

[R MLO tomo (2 of 3) · tomo slice 32/63.0]
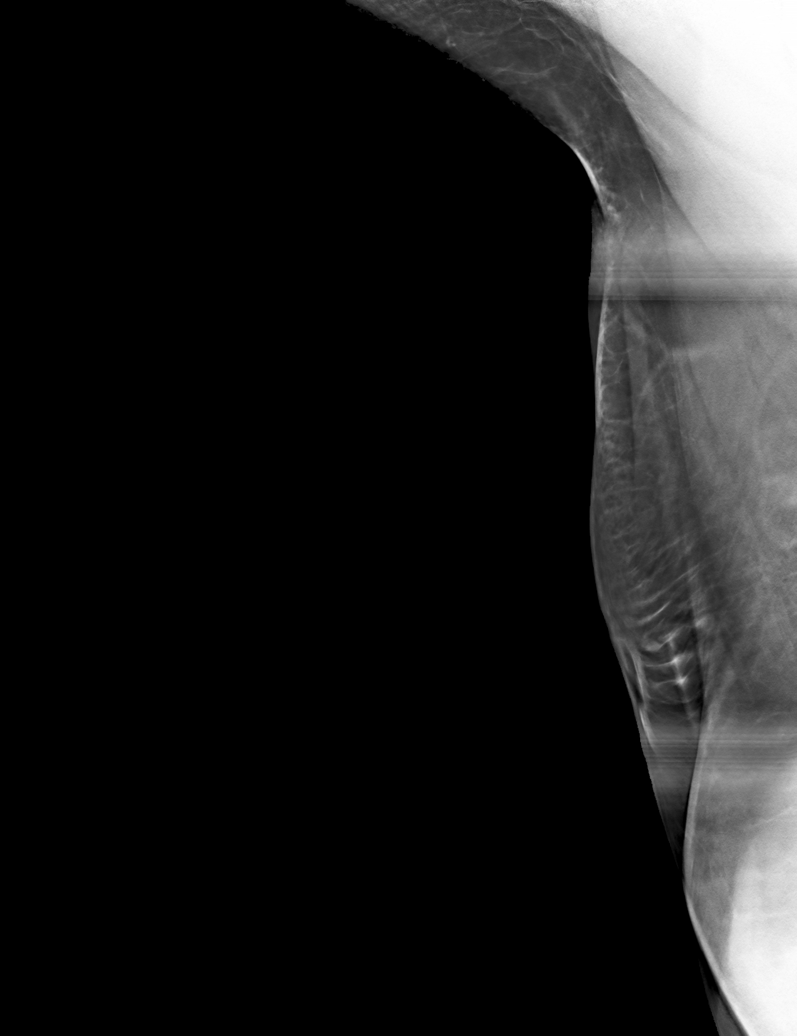

[R MLO tomo (3 of 3) · tomo slice 31/62.0]
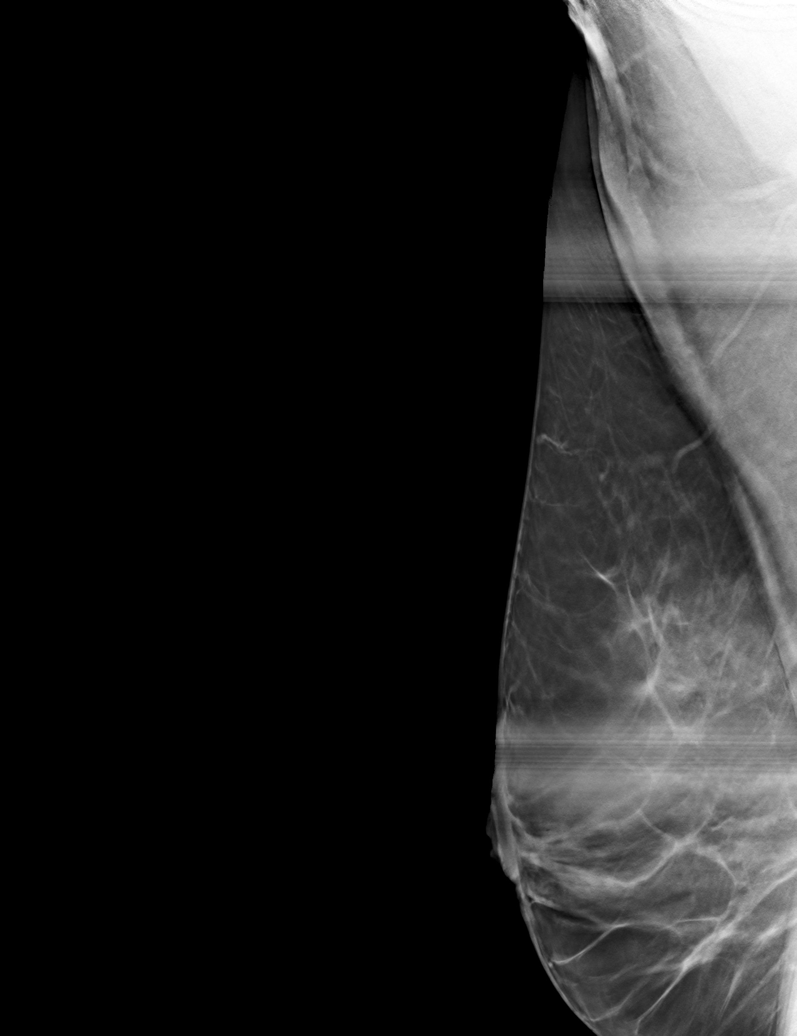

[6 of 18 positions shown; findings below may reference images not displayed]

FINDINGS: 3D Mammographic images were obtained following ultrasound guided
biopsy of focally thickened right axillary lymph node. The biopsy
marking clip is in expected position at the site of biopsy.
IMPRESSION: Appropriate positioning of the Hydromark coil biopsy marking clip at
the site of biopsy in the right axilla.

Final Assessment: Post Procedure Mammograms for Marker Placement

## 2021-07-16 LAB — SURGICAL PATHOLOGY

## 2021-08-04 ENCOUNTER — Ambulatory Visit (INDEPENDENT_AMBULATORY_CARE_PROVIDER_SITE_OTHER): Payer: BC Managed Care – PPO | Admitting: Advanced Practice Midwife

## 2021-08-04 ENCOUNTER — Encounter: Payer: Self-pay | Admitting: Advanced Practice Midwife

## 2021-08-04 ENCOUNTER — Other Ambulatory Visit: Payer: Self-pay

## 2021-08-04 VITALS — BP 142/78 | Ht 68.0 in | Wt 169.0 lb

## 2021-08-04 DIAGNOSIS — Z803 Family history of malignant neoplasm of breast: Secondary | ICD-10-CM | POA: Diagnosis not present

## 2021-08-04 DIAGNOSIS — Z09 Encounter for follow-up examination after completed treatment for conditions other than malignant neoplasm: Secondary | ICD-10-CM

## 2021-08-04 NOTE — Progress Notes (Signed)
Patient ID: Vickie [name], female   DOB: September 02, 1966, 55 y.o.   MRN: [id number]  Reason for Consult: Follow-up   Referred by Rod Can, CNM  Subjective:  HPI:  Vickie [name] is a 54 y.o. female being seen to discuss updating her MyRisk testing. Her original testing was done in 2005 and was negative per her report (done in [place] and she will sign release for records). She had a recent biopsy of right axilla lymph node which was benign with reactive paracortical hyperplasia. She has no other breast concerns today.  Past Medical History:  Diagnosis Date   Family history of breast cancer    Genetic testing of female    pt had done in past, records request sent   Family History  Problem Relation Age of Onset   Breast cancer Mother    Breast cancer Sister    Breast cancer Maternal Grandmother    Breast cancer Other    Past Surgical History:  Procedure Laterality Date   ABLATION Bilateral 2007   BREAST BIOPSY Left    benign   BREAST BIOPSY Left    benign   BREAST BIOPSY Right 07/13/2021   u/s bx axilla hrdro coil shape 3 path pending   CESAREAN SECTION     TUBAL LIGATION      Short Social History:  Social History   Tobacco Use   Smoking status: Never   Smokeless tobacco: Never  Substance Use Topics   Alcohol use: Yes    Comment: occ    No Known Allergies  No current outpatient medications on file.   No current facility-administered medications for this visit.    Review of Systems  Constitutional:  Negative for chills and fever.  HENT:  Negative for congestion, ear discharge, ear pain, hearing loss, sinus pain and sore throat.   Eyes:  Negative for blurred vision and double vision.  Respiratory:  Negative for cough, shortness of breath and wheezing.   Cardiovascular:  Negative for chest pain, palpitations and leg swelling.  Gastrointestinal:  Negative for abdominal pain, blood in stool, constipation, diarrhea, heartburn, melena, nausea and vomiting.   Genitourinary:  Negative for dysuria, flank pain, frequency, hematuria and urgency.  Musculoskeletal:  Negative for back pain, joint pain and myalgias.  Skin:  Negative for itching and rash.  Neurological:  Negative for dizziness, tingling, tremors, sensory change, speech change, focal weakness, seizures, loss of consciousness, weakness and headaches.  Endo/Heme/Allergies:  Negative for environmental allergies. Does not bruise/bleed easily.  Psychiatric/Behavioral:  Negative for depression, hallucinations, memory loss, substance abuse and suicidal ideas. The patient is not nervous/anxious and does not have insomnia.        Objective:  Objective   Vitals:   08/04/21 1356  BP: (!) 142/78  Weight: 169 lb (76.7 kg)  Height: _0  (1.727 m)   Body mass index is 25.7 kg/m. Constitutional: Well nourished, well developed female in no acute distress.  HEENT: normal Skin: Warm and dry.  Cardiovascular: Regular rate and rhythm.   Extremity:  no edema   Respiratory: Clear to auscultation bilateral. Normal respiratory effort Abdomen: soft, nontender, nondistended, no abnormal masses, no epigastric pain Neuro: DTRs 2+, Cranial nerves grossly intact Psych: Alert and Oriented x3. No memory deficits. Normal mood and affect.    Assessment/Plan:     55 y.o. G1 P1 female desiring updated MyRisk testing  Blood draw done today Call [facility] to discuss financial Sign release for previous MyRisk testing     Rod Can  [facility] 08/04/2021, 2:25 PM

## 2021-08-15 DIAGNOSIS — Z1371 Encounter for nonprocreative screening for genetic disease carrier status: Secondary | ICD-10-CM

## 2021-08-15 DIAGNOSIS — Z9189 Other specified personal risk factors, not elsewhere classified: Secondary | ICD-10-CM

## 2021-08-15 HISTORY — DX: Other specified personal risk factors, not elsewhere classified: Z91.89

## 2021-08-15 HISTORY — DX: Encounter for nonprocreative screening for genetic disease carrier status: Z13.71

## 2021-08-26 ENCOUNTER — Encounter: Payer: Self-pay | Admitting: Obstetrics and Gynecology

## 2021-09-01 ENCOUNTER — Other Ambulatory Visit (INDEPENDENT_AMBULATORY_CARE_PROVIDER_SITE_OTHER): Payer: Self-pay | Admitting: Vascular Surgery

## 2021-09-01 ENCOUNTER — Encounter (INDEPENDENT_AMBULATORY_CARE_PROVIDER_SITE_OTHER): Payer: BC Managed Care – PPO

## 2021-09-01 DIAGNOSIS — Z9889 Other specified postprocedural states: Secondary | ICD-10-CM

## 2021-09-10 ENCOUNTER — Encounter (INDEPENDENT_AMBULATORY_CARE_PROVIDER_SITE_OTHER): Payer: Self-pay | Admitting: Vascular Surgery

## 2021-09-10 ENCOUNTER — Other Ambulatory Visit: Payer: Self-pay

## 2021-09-10 ENCOUNTER — Ambulatory Visit (INDEPENDENT_AMBULATORY_CARE_PROVIDER_SITE_OTHER): Payer: BC Managed Care – PPO | Admitting: Vascular Surgery

## 2021-09-10 DIAGNOSIS — I83819 Varicose veins of unspecified lower extremities with pain: Secondary | ICD-10-CM | POA: Insufficient documentation

## 2021-09-10 NOTE — Progress Notes (Signed)
    MRN : [id number]  Shawnte Winton is a 55 y.o. (1966/03/06) female who presents with chief complaint of  Chief Complaint  Patient presents with   Follow-up    LGSV  .    The patient's left lower extremity was sterilely prepped and draped.  The ultrasound machine was used to visualize the left great saphenous vein throughout its course.  A segment at the knee was selected for access.  The saphenous vein was accessed without difficulty using ultrasound guidance with a micropuncture needle.   An 0.018  wire was placed beyond the saphenofemoral junction through the sheath and the microneedle was removed.  The 65 cm sheath was then placed over the wire and the wire and dilator were removed.  The laser fiber was placed through the sheath and its tip was placed approximately 2 cm below the saphenofemoral junction.  Tumescent anesthesia was then created with a dilute lidocaine solution.  Laser energy was then delivered with constant withdrawal of the sheath and laser fiber.  Approximately 1582 Joules of energy were delivered over a length of 37 cm.  Sterile dressings were placed.  The patient tolerated the procedure well without complications.

## 2021-09-14 ENCOUNTER — Other Ambulatory Visit (INDEPENDENT_AMBULATORY_CARE_PROVIDER_SITE_OTHER): Payer: Self-pay | Admitting: Vascular Surgery

## 2021-09-14 DIAGNOSIS — I83812 Varicose veins of left lower extremities with pain: Secondary | ICD-10-CM

## 2021-09-15 DIAGNOSIS — H6983 Other specified disorders of Eustachian tube, bilateral: Secondary | ICD-10-CM | POA: Diagnosis not present

## 2021-09-16 ENCOUNTER — Other Ambulatory Visit: Payer: Self-pay

## 2021-09-16 ENCOUNTER — Ambulatory Visit (INDEPENDENT_AMBULATORY_CARE_PROVIDER_SITE_OTHER): Payer: BC Managed Care – PPO

## 2021-09-16 DIAGNOSIS — I83812 Varicose veins of left lower extremities with pain: Secondary | ICD-10-CM

## 2021-09-30 NOTE — Progress Notes (Signed)
    MRN : [id number]  Vickie [name] is a 55 y.o. (Jun 11, 1966) female who presents with chief complaint of No chief complaint on file. .    The patient's right lower extremity was sterilely prepped and draped.  The ultrasound machine was used to visualize the right great saphenous vein throughout its course.  A segment at the knee was selected for access.  The saphenous vein was accessed without difficulty using ultrasound guidance with a micropuncture needle.   An 0.018  wire was placed beyond the saphenofemoral junction through the sheath and the microneedle was removed.  The 65 cm sheath was then placed over the wire and the wire and dilator were removed.  The laser fiber was placed through the sheath and its tip was placed approximately 2 cm below the saphenofemoral junction.  Tumescent anesthesia was then created with a dilute lidocaine solution.  Laser energy was then delivered with constant withdrawal of the sheath and laser fiber.  Approximately 1586 Joules of energy were delivered over a length of 38 cm.  Sterile dressings were placed.  The patient tolerated the procedure well without complications.

## 2021-10-01 ENCOUNTER — Other Ambulatory Visit: Payer: Self-pay

## 2021-10-01 ENCOUNTER — Encounter (INDEPENDENT_AMBULATORY_CARE_PROVIDER_SITE_OTHER): Payer: Self-pay | Admitting: Vascular Surgery

## 2021-10-01 ENCOUNTER — Ambulatory Visit (INDEPENDENT_AMBULATORY_CARE_PROVIDER_SITE_OTHER): Payer: BC Managed Care – PPO | Admitting: Vascular Surgery

## 2021-10-01 VITALS — BP 121/74 | HR 72 | Resp 16 | Wt 165.2 lb

## 2021-10-01 DIAGNOSIS — I83819 Varicose veins of unspecified lower extremities with pain: Secondary | ICD-10-CM | POA: Diagnosis not present

## 2021-10-06 ENCOUNTER — Other Ambulatory Visit (INDEPENDENT_AMBULATORY_CARE_PROVIDER_SITE_OTHER): Payer: Self-pay | Admitting: Vascular Surgery

## 2021-10-06 DIAGNOSIS — I83813 Varicose veins of bilateral lower extremities with pain: Secondary | ICD-10-CM

## 2021-10-07 ENCOUNTER — Other Ambulatory Visit: Payer: Self-pay

## 2021-10-07 ENCOUNTER — Ambulatory Visit (INDEPENDENT_AMBULATORY_CARE_PROVIDER_SITE_OTHER): Payer: BC Managed Care – PPO

## 2021-10-07 DIAGNOSIS — I83813 Varicose veins of bilateral lower extremities with pain: Secondary | ICD-10-CM

## 2021-10-29 ENCOUNTER — Encounter (INDEPENDENT_AMBULATORY_CARE_PROVIDER_SITE_OTHER): Payer: Self-pay | Admitting: Vascular Surgery

## 2021-10-29 ENCOUNTER — Ambulatory Visit (INDEPENDENT_AMBULATORY_CARE_PROVIDER_SITE_OTHER): Payer: BC Managed Care – PPO | Admitting: Vascular Surgery

## 2021-10-29 ENCOUNTER — Other Ambulatory Visit: Payer: Self-pay

## 2021-10-29 VITALS — BP 134/77 | HR 74 | Ht 68.0 in | Wt 168.0 lb

## 2021-10-29 DIAGNOSIS — I83819 Varicose veins of unspecified lower extremities with pain: Secondary | ICD-10-CM

## 2021-10-29 NOTE — Progress Notes (Signed)
MRN : [id number]  Vickie [name] is a 55 y.o. ([birth year]) female who presents with chief complaint of follow up s/p laser.  History of Present Illness:  The patient returns to the office for followup status post laser ablation of the left saphenous vein on 09/10/2021 and laser ablation of the right saphenous vein on 10/01/2021.  The patient note significant improvement in the lower extremity pain but not resolution of the symptoms. The patient notes multiple residual varicosities bilaterally which continued to hurt with dependent positions and remained tender to palpation. The patient's swelling is minimally from preoperative status. The patient continues to wear graduated compression stockings on a daily basis but these are not eliminating the pain and discomfort. The patient continues to use over-the-counter anti-inflammatory medications to treat the pain and related symptoms but this has not given the patient relief. The patient notes the pain in the lower extremities is causing problems with daily exercise, problems at work and even with household activities such as preparing meals and doing dishes.  The patient is otherwise done well and there have been no complications related to the laser procedure or interval changes in the patient's overall   Post laser ultrasound shows successful ablation of the right and left great saphenous veins  No outpatient medications have been marked as taking for the 10/29/21 encounter (Appointment) with Delana Meyer, Dolores Lory, MD.    Past Medical History:  Diagnosis Date   BRCA negative 08/2021   MyRisk neg except TSC2 VUS   Family history of breast cancer    Genetic testing of female    pt had done in past, records request sent   Increased risk of breast cancer 08/2021   IBIS=22.6%/riskscore=20.9%    Past Surgical History:  Procedure Laterality Date   ABLATION Bilateral 2007   BREAST BIOPSY Left    benign   BREAST BIOPSY Left    benign   BREAST  BIOPSY Right 07/13/2021   u/s bx axilla hrdro coil shape 3 path pending   CESAREAN SECTION     TUBAL LIGATION      Social History Social History   Tobacco Use   Smoking status: Never   Smokeless tobacco: Never  Vaping Use   Vaping Use: Never used  Substance Use Topics   Alcohol use: Yes    Comment: occ   Drug use: Never    Family History Family History  Problem Relation Age of Onset   Breast cancer Mother    Breast cancer Sister    Breast cancer Maternal Grandmother    Breast cancer Other     No Known Allergies   REVIEW OF SYSTEMS (Negative unless checked)  Constitutional: [] Weight loss  [] Fever  [] Chills Cardiac: [] Chest pain   [] Chest pressure   [] Palpitations   [] Shortness of breath when laying flat   [] Shortness of breath with exertion. Vascular:  [] Pain in legs with walking   [] Pain in legs at rest  [] History of DVT   [] Phlebitis   [x] Swelling in legs   [] Varicose veins   [] Non-healing ulcers Pulmonary:   [] Uses home oxygen   [] Productive cough   [] Hemoptysis   [] Wheeze  [] COPD   [] Asthma Neurologic:  [] Dizziness   [] Seizures   [] History of stroke   [] History of TIA  [] Aphasia   [] Vissual changes   [] Weakness or numbness in arm   [] Weakness or numbness in leg Musculoskeletal:   [] Joint swelling   [] Joint pain   [] Low back pain Hematologic:  [] Easy bruising  []   Easy bleeding   [] Hypercoagulable state   [] Anemic Gastrointestinal:  [] Diarrhea   [] Vomiting  [] Gastroesophageal reflux/heartburn   [] Difficulty swallowing. Genitourinary:  [] Chronic kidney disease   [] Difficult urination  [] Frequent urination   [] Blood in urine Skin:  [] Rashes   [] Ulcers  Psychological:  [] History of anxiety   []  History of major depression.  Physical Examination  There were no vitals filed for this visit. There is no height or weight on file to calculate BMI. Gen: WD/WN, NAD Head: [place]/AT, No temporalis wasting.  Ear/Nose/Throat: Hearing grossly intact, nares w/o erythema or drainage,  pinna without lesions Eyes: PER, EOMI, sclera nonicteric.  Neck: Supple, no gross masses.  No JVD.  Pulmonary:  Good air movement, no audible wheezing, no use of accessory muscles.  Cardiac: RRR, precordium not hyperdynamic. Vascular:  scattered large greater than 8 mm varicosities present bilaterally.  Mild venous stasis changes to the legs bilaterally.  1+ soft pitting edema  Vessel Right Left  Radial Palpable Palpable  Gastrointestinal: soft, non-distended. No guarding/no peritoneal signs.  Musculoskeletal: M/S 5/5 throughout.  No deformity.  Neurologic: CN 2-12 intact. Pain and light touch intact in extremities.  Symmetrical.  Speech is fluent. Motor exam as listed above. Psychiatric: Judgment intact, Mood & affect appropriate for pt's clinical situation. Dermatologic: Venous rashes no ulcers noted.  No changes consistent with cellulitis. Lymph : No lichenification or skin changes of chronic lymphedema.  CBC Lab Results  Component Value Date   WBC 8.1 10/21/2020   HGB 12.7 10/21/2020   HCT 38.0 10/21/2020   MCV 88 10/21/2020   PLT 293 10/21/2020    BMET    Component Value Date/Time   NA 140 10/21/2020 1037   K 4.4 10/21/2020 1037   CL 101 10/21/2020 1037   CO2 24 10/21/2020 1037   GLUCOSE 85 10/21/2020 1037   BUN 12 10/21/2020 1037   CREATININE 0.75 10/21/2020 1037   CALCIUM 9.8 10/21/2020 1037   GFRNONAA 92 10/21/2020 1037   GFRAA 106 10/21/2020 1037   CrCl cannot be calculated (Patient's most recent lab result is older than the maximum 21 days allowed.).  COAG No results found for: INR, PROTIME  Radiology VAS [place] LOWER EXTREMITY VENOUS POST ABLATION  Result Date: 10/15/2021  Lower Venous Reflux Study Patient Name:  MRS. Vickie [name]  Date of Exam:   10/07/2021 Medical Rec #: [id number]          Accession #:    [id number] Date of Birth: 06/26/1966          Patient Gender: F Patient Age:   48 years Exam Location:  [facility] Procedure:      VAS [place] LOWER  EXTREMITY VENOUS POST ABLATION Referring Phys: Trinady Milewski --------------------------------------------------------------------------------  Indications: Post Rt GSV Ablation.  Performing Technologist: Almira Coaster RVS  Examination Guidelines: A complete evaluation includes B-mode imaging, spectral Doppler, color Doppler, and power Doppler as needed of all accessible portions of each vessel. Bilateral testing is considered an integral part of a complete examination. Limited examinations for reoccurring indications may be performed as noted. The reflux portion of the exam is performed with the patient in reverse Trendelenburg. Significant venous reflux is defined as >500 ms in the superficial venous system, and >1 second in the deep venous system.  Venous Reflux Times +--------------+--------+------+----------+------------+-----------------------+  RIGHT          Reflux   Reflux   Reflux   Diameter cms Comments  No        Yes      Time                                          +--------------+--------+------+----------+------------+-----------------------+  CFV            no                                                               +--------------+--------+------+----------+------------+-----------------------+  FV prox        no                                                               +--------------+--------+------+----------+------------+-----------------------+  FV mid         no                                                               +--------------+--------+------+----------+------------+-----------------------+  FV dist        no                                                               +--------------+--------+------+----------+------------+-----------------------+  Popliteal      no                                                               +--------------+--------+------+----------+------------+-----------------------+  GSV at SFJ     no                                                                +--------------+--------+------+----------+------------+-----------------------+  GSV prox thigh                                         prior  ablation/stripping       +--------------+--------+------+----------+------------+-----------------------+  GSV mid thigh                                          prior                                                                            ablation/stripping       +--------------+--------+------+----------+------------+-----------------------+  GSV dist thigh                                         prior                                                                            ablation/stripping       +--------------+--------+------+----------+------------+-----------------------+  GSV at knee    no                                                               +--------------+--------+------+----------+------------+-----------------------+  GSV prox calf  no                                                               +--------------+--------+------+----------+------------+-----------------------+  SSV Pop Fossa  no                                                               +--------------+--------+------+----------+------------+-----------------------+   Summary: Right: - No flow seen in the Rt GSV from Knee to approximately 1.27 cms from SFJ post Ablation.  *See table(s) above for measurements and observations. Electronically signed by Hortencia Pilar MD on 10/15/2021 at 8:25:48 PM.    Final      Assessment/Plan 1. Varicose veins with pain Recommend:  The patient has had successful ablation of the previously incompetent saphenous venous system but still has persistent symptoms of pain and swelling that are having a negative impact on daily life and daily activities.  Patient should undergo injection sclerotherapy to treat the residual  varicosities.  The risks, benefits and alternative therapies were reviewed in detail with the patient.  All questions were answered.  The patient  agrees to proceed with sclerotherapy at their convenience.  The patient will continue wearing the graduated compression stockings and using the over-the-counter pain medications to treat her symptoms.         Hortencia Pilar, MD  10/29/2021 3:48 PM

## 2021-10-30 ENCOUNTER — Encounter (INDEPENDENT_AMBULATORY_CARE_PROVIDER_SITE_OTHER): Payer: Self-pay | Admitting: Vascular Surgery

## 2022-01-04 ENCOUNTER — Telehealth: Payer: Self-pay

## 2022-01-04 NOTE — Telephone Encounter (Signed)
Pt calling to discuss a double mastectomy. Do you put in a referral for this?

## 2022-01-05 NOTE — Telephone Encounter (Signed)
One of her older sister has stage 4 breast cancer and her Brother has stage 4 prostate cancer her Mother died from breast cancer, bother Grandmothers had breast cancer, she had the genetic testing and was low counts,  Her Daughter had her breast removed because of the history and her Daughter sister had breast cancer at 56. Pt is scared because of the strong family history.

## 2022-01-07 NOTE — Telephone Encounter (Signed)
Pt aware.

## 2022-01-07 NOTE — Telephone Encounter (Signed)
I believe she can call and schedule appt with doctor of choice for this (general surgeon).  If she needs name, I would try Dr Dwain Sarna.

## 2022-01-11 NOTE — Telephone Encounter (Signed)
Pt calling; needs referral to [facility]; she called them and they do need a referral for a double mastectomy; their fax # [phone]  Pt's number [phone] Courtesy call to pt Ph not in office today; is back  tomorrow; msg will be sent.

## 2022-01-12 ENCOUNTER — Other Ambulatory Visit: Payer: Self-pay | Admitting: Obstetrics & Gynecology

## 2022-01-12 DIAGNOSIS — Z9189 Other specified personal risk factors, not elsewhere classified: Secondary | ICD-10-CM

## 2022-01-12 NOTE — Telephone Encounter (Signed)
Referral placed for [facility] (is this [facility]?)  had to place as external referral

## 2022-02-02 DIAGNOSIS — Z803 Family history of malignant neoplasm of breast: Secondary | ICD-10-CM | POA: Diagnosis not present

## 2022-02-02 DIAGNOSIS — Z1239 Encounter for other screening for malignant neoplasm of breast: Secondary | ICD-10-CM | POA: Diagnosis not present

## 2022-02-08 ENCOUNTER — Ambulatory Visit (INDEPENDENT_AMBULATORY_CARE_PROVIDER_SITE_OTHER): Payer: BC Managed Care – PPO | Admitting: Vascular Surgery

## 2022-02-18 ENCOUNTER — Other Ambulatory Visit: Payer: Self-pay | Admitting: General Surgery

## 2022-02-18 DIAGNOSIS — Z803 Family history of malignant neoplasm of breast: Secondary | ICD-10-CM

## 2022-02-18 DIAGNOSIS — Z1239 Encounter for other screening for malignant neoplasm of breast: Secondary | ICD-10-CM

## 2022-03-11 ENCOUNTER — Ambulatory Visit (INDEPENDENT_AMBULATORY_CARE_PROVIDER_SITE_OTHER): Payer: BC Managed Care – PPO | Admitting: Vascular Surgery

## 2022-03-11 ENCOUNTER — Ambulatory Visit
Admission: RE | Admit: 2022-03-11 | Discharge: 2022-03-11 | Disposition: A | Discharge status: Home / Self Care | Payer: BC Managed Care – PPO | Source: Ambulatory Visit | Attending: General Surgery | Admitting: General Surgery

## 2022-03-11 DIAGNOSIS — Z803 Family history of malignant neoplasm of breast: Secondary | ICD-10-CM | POA: Diagnosis not present

## 2022-03-11 DIAGNOSIS — Z1239 Encounter for other screening for malignant neoplasm of breast: Secondary | ICD-10-CM

## 2022-03-11 IMAGING — MR MR BREAST BILAT WO/W CM
8 of 13 series · 33 of 48 positions shown · IV contrast (gadavist)
Comparison: [DATE]

CLINICAL DATA: History of benign RIGHT breast biopsy. Family
history of breast cancer. Patient's mother was diagnosed at age 63,
maternal grandmother at age 68, sister at 60, maternal niece in her
30s.

EXAM:
BILATERAL BREAST MRI WITH AND WITHOUT CONTRAST
TECHNIQUE: Multiplanar, multisequence MR images of both breasts were obtained
prior to and following the intravenous administration of 7 ml of
Gadavist

[Series 2: t2_tirm_tra ipat (a-p) · axial · 3.0mm · 0.70mm/px · 1 of 44 slices shown]
[im 1/44]
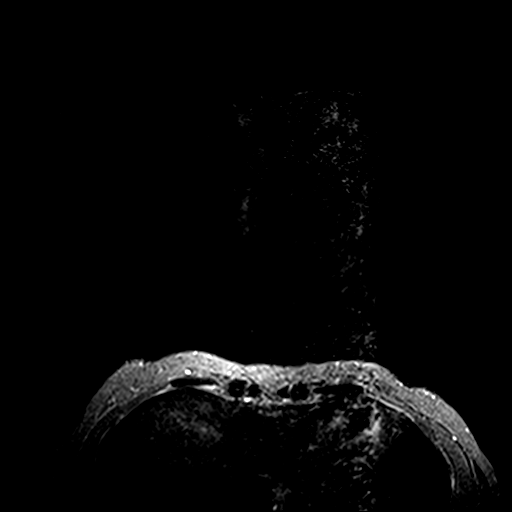

[Series 3: fl3d pre-cm no · axial · non-contrast · 1.2mm · 0.94mm/px · z∈[-98,+74]mm · 5 of 144 slices shown]
[im 1/144]
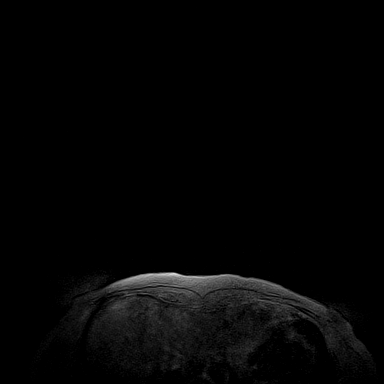
[im 36/144]
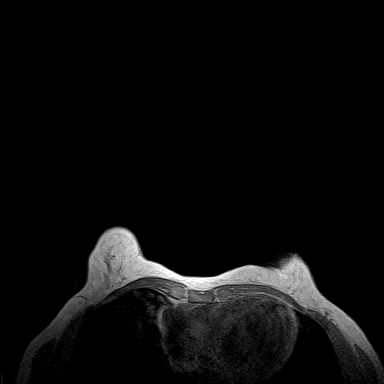
[im 72/144]
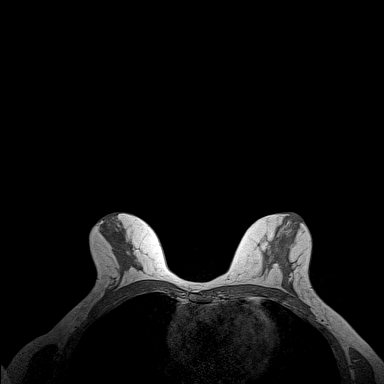
[im 108/144]
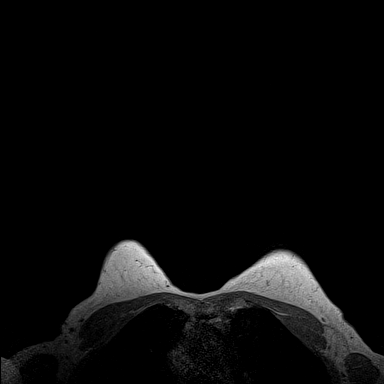
[im 144/144]
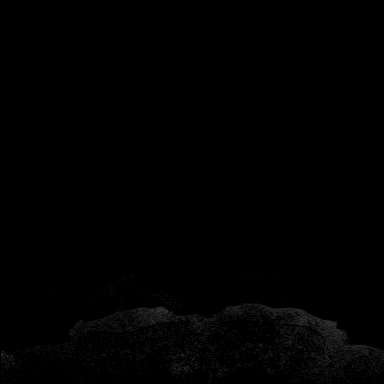

[Series 4: fl3d pre-cm · axial · non-contrast · 1.2mm · 0.94mm/px · z∈[-98,+74]mm · 5 of 144 slices shown]
[im 1/144]
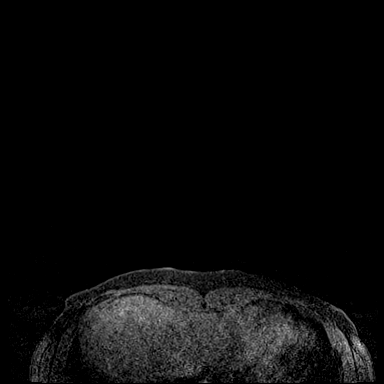
[im 36/144]
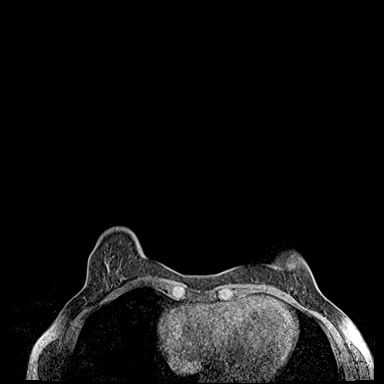
[im 72/144]
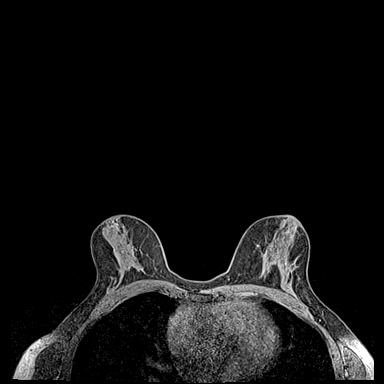
[im 108/144]
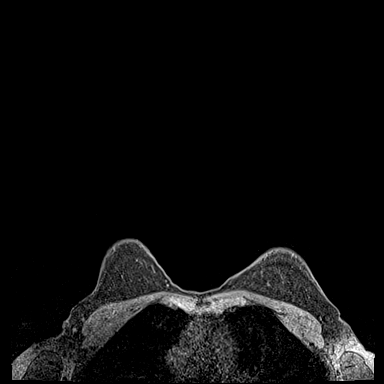
[im 144/144]
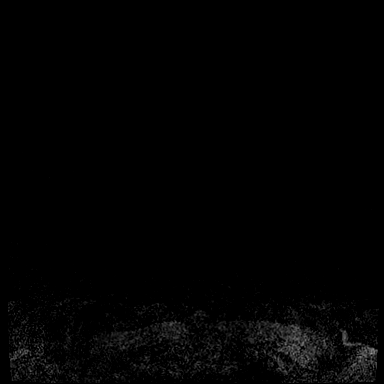

[Series 5: fl3d post-cm 20 · axial · 1.2mm · 0.94mm/px · z∈[-98,+74]mm · 5 of 144 slices shown (1 of 3)]
[im 1/144]
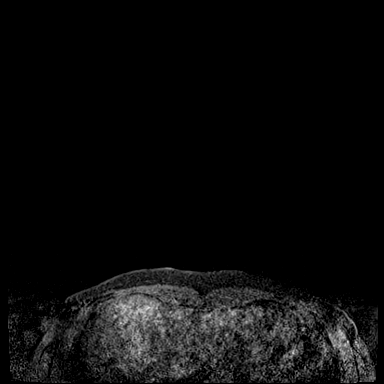
[im 36/144]
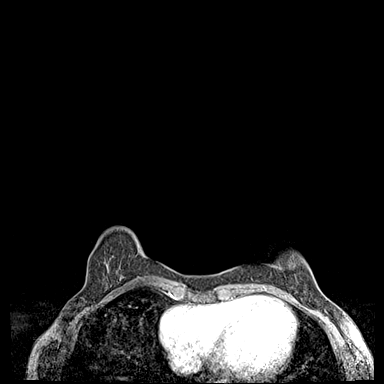
[im 72/144]
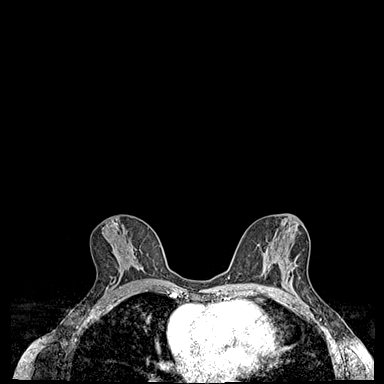
[im 108/144]
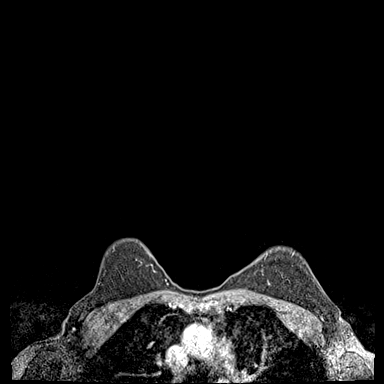
[im 144/144]
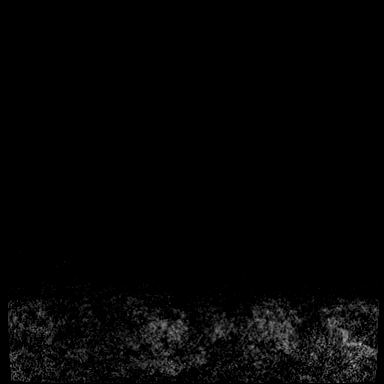

[Series 6: fl3d post-cm 20 · axial · 1.2mm · 0.94mm/px · z∈[-98,+74]mm · 5 of 144 slices shown (2 of 3)]
[im 1/144]
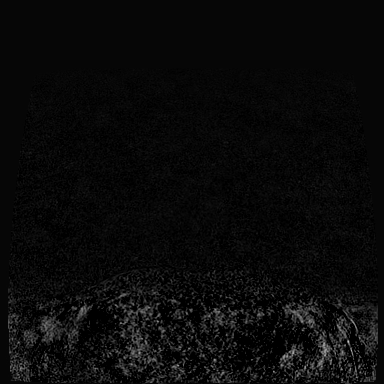
[im 36/144]
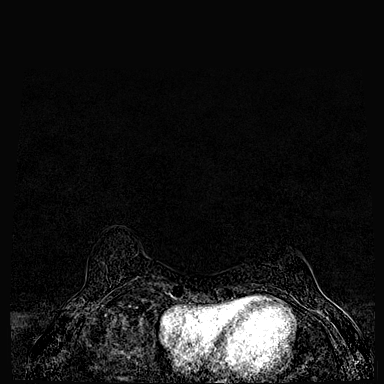
[im 72/144]
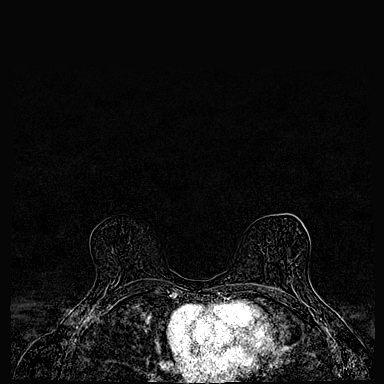
[im 108/144]
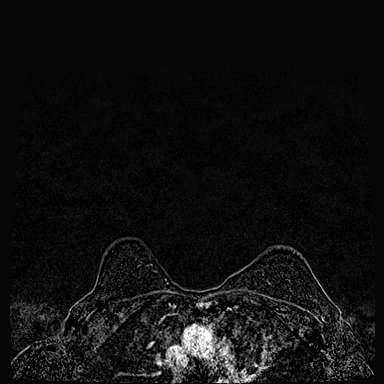
[im 144/144]
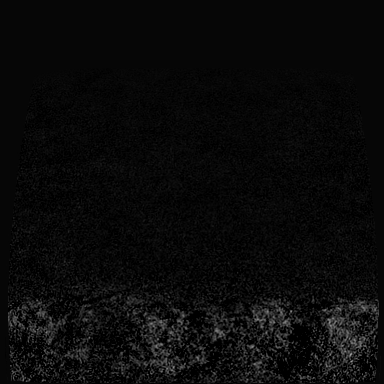

[Series 7: fl3d post-cm 20 · axial · 172.8mm · 0.94mm/px · 1 of 1 slices shown (3 of 3)]
[im 1/1]
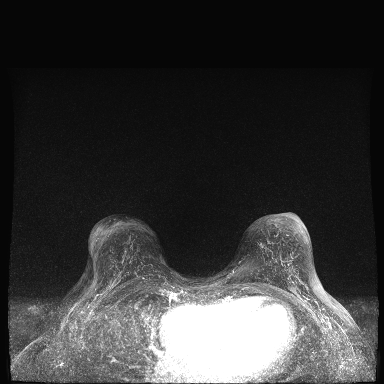

[Series 8: fl3d post-cm 3 · axial · 1.2mm · 0.94mm/px · z∈[-98,+74]mm · 5 of 144 slices shown (1 of 2)]
[im 1/144]
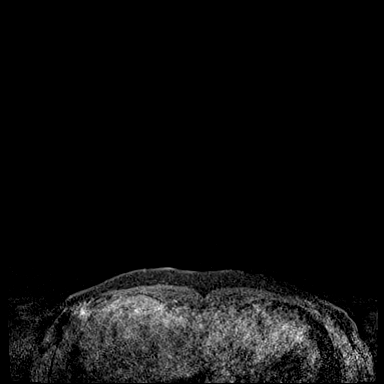
[im 36/144]
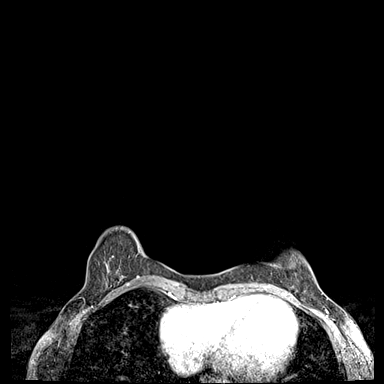
[im 72/144]
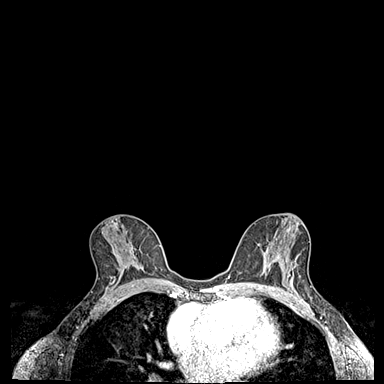
[im 108/144]
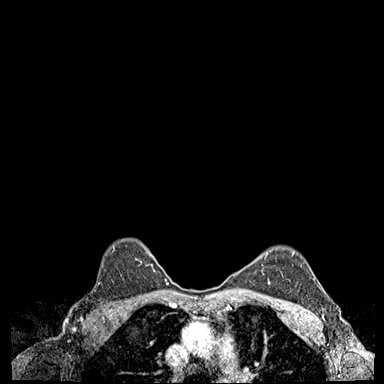
[im 144/144]
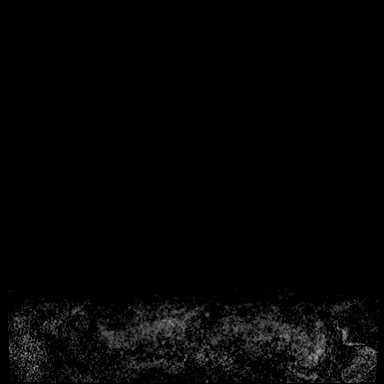

[Series 9: fl3d post-cm 3 · axial · 1.2mm · 0.94mm/px · z∈[-98,+74]mm · 6 of 144 slices shown (2 of 2)]
[im 1/144]
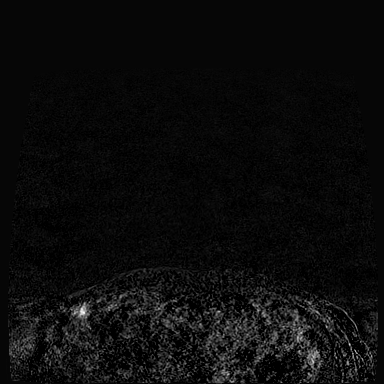
[im 29/144]
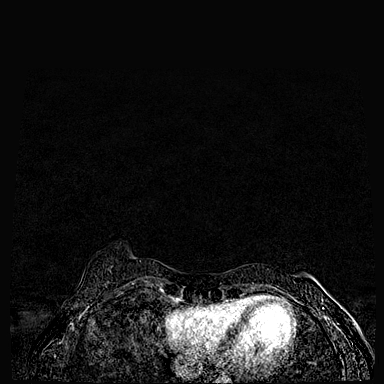
[im 58/144]
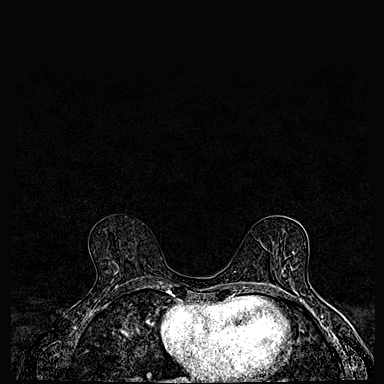
[im 86/144]
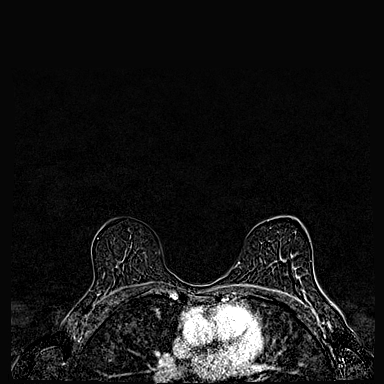
[im 115/144]
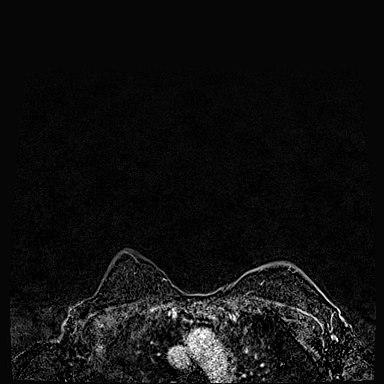
[im 144/144]
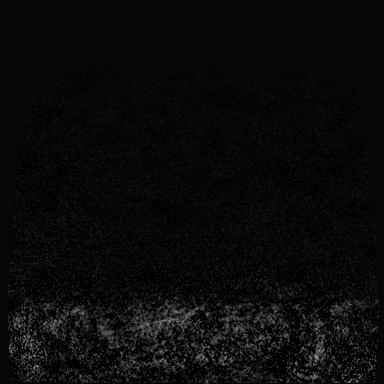

[33 of 48 positions shown; findings below may reference images not displayed]

Three-dimensional MR images were rendered by post-processing of the
original MR data on an independent workstation. The
three-dimensional MR images were interpreted, and findings are
reported in the following complete MRI report for this study. Three
dimensional images were evaluated at the independent interpreting
workstation using the DynaCAD thin client.
FINDINGS: Breast composition: c. Heterogeneous fibroglandular tissue.

Background parenchymal enhancement: Minimal

Right breast: Within the LOWER central RIGHT breast, there is
clumped non mass enhancement spanning 1.6 x 0.4 centimeters. (Image
83 of series 9). This lesion demonstrates washout type kinetics.
Otherwise the RIGHT breast is negative.

Left breast: No mass or abnormal enhancement.

Lymph nodes: No abnormal appearing lymph nodes.

Ancillary findings:  None.
IMPRESSION: 1. Indeterminate clumped non mass enhancement in the LOWER central
portion of the RIGHT breast measuring 1.6 centimeters. Biopsy is
recommended. Given the appearance, sonographic correlate is felt to
be unlikely.
2. LEFT breast is negative.
3. No adenopathy.

RECOMMENDATION:
Recommend MR guided core biopsy of the RIGHT breast.

BI-RADS CATEGORY  4: Suspicious.

## 2022-03-11 MED ORDER — GADOBUTROL 1 MMOL/ML IV SOLN
7.0000 mL | Freq: Once | INTRAVENOUS | Status: AC | PRN
  Administered 2022-03-11: 7 mL via INTRAVENOUS

## 2022-03-12 ENCOUNTER — Other Ambulatory Visit: Payer: Self-pay | Admitting: General Surgery

## 2022-03-12 DIAGNOSIS — R9389 Abnormal findings on diagnostic imaging of other specified body structures: Secondary | ICD-10-CM

## 2022-03-24 ENCOUNTER — Ambulatory Visit
Admission: RE | Admit: 2022-03-24 | Discharge: 2022-03-24 | Disposition: A | Discharge status: Home / Self Care | Payer: BC Managed Care – PPO | Source: Ambulatory Visit | Attending: General Surgery | Admitting: General Surgery

## 2022-03-24 DIAGNOSIS — R9389 Abnormal findings on diagnostic imaging of other specified body structures: Secondary | ICD-10-CM

## 2022-03-24 DIAGNOSIS — N6011 Diffuse cystic mastopathy of right breast: Secondary | ICD-10-CM | POA: Diagnosis not present

## 2022-03-24 DIAGNOSIS — Z853 Personal history of malignant neoplasm of breast: Secondary | ICD-10-CM | POA: Diagnosis not present

## 2022-03-24 DIAGNOSIS — R928 Other abnormal and inconclusive findings on diagnostic imaging of breast: Secondary | ICD-10-CM | POA: Diagnosis not present

## 2022-03-24 HISTORY — PX: BREAST BIOPSY: SHX20

## 2022-03-24 IMAGING — MR MR BREAST BX W/ LOC DEV 1ST LEASION IMAGE BX SPEC MR GUIDE*R*
9 of 12 series · 33 of 48 positions shown · IV contrast (7 gadavist)
Comparison: None Available.
COMPARISON: None Available.

Addendum:
CLINICAL DATA: 55-year-old female with strong family history of
breast cancer. Recent screening MRI showed abnormal enhancement in
the lower central right breast.

EXAM:
MRI GUIDED CORE NEEDLE BIOPSY OF THE RIGHT BREAST
TECHNIQUE: Multiplanar, multisequence MR imaging of the right breast was
performed both before and after administration of intravenous
contrast.
CONTRAST:  7mL GADAVIST GADOBUTROL 1 MMOL/ML IV SOLN

[Series 2: fiducial unilateral · sagittal · 2.0mm · 1.33mm/px · 3 of 52 slices shown]
[im 1/52]
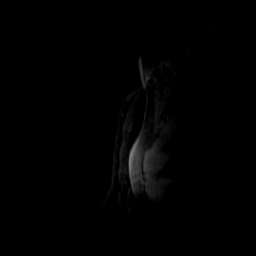
[im 26/52]
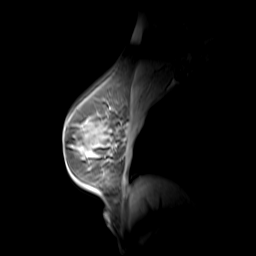
[im 52/52]
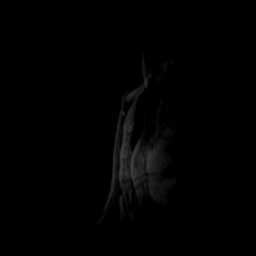

[Series 3: dynamic pre · axial · non-contrast · 1.3mm · 0.73mm/px · z∈[-92,+93]mm · 5 of 144 slices shown]
[im 1/144]
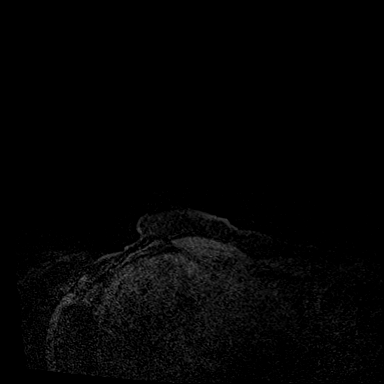
[im 36/144]
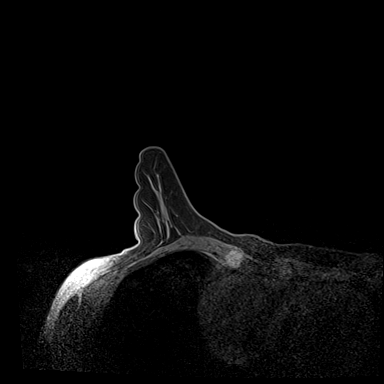
[im 72/144]
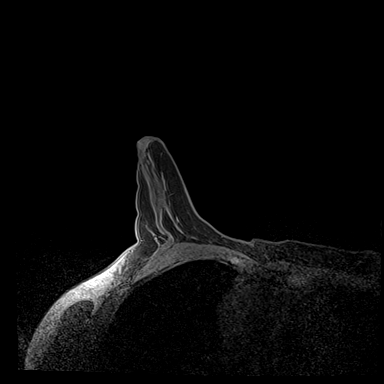
[im 108/144]
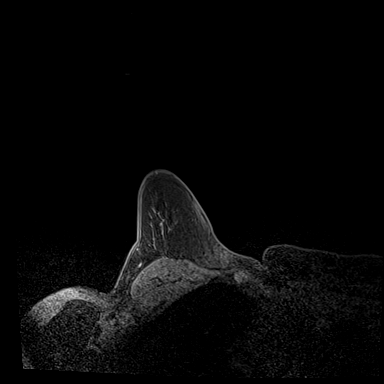
[im 144/144]
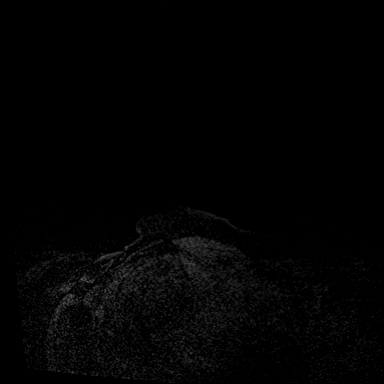

[Series 4: dynamic post 20 · axial · 1.3mm · 0.73mm/px · z∈[-92,+93]mm · 4 of 144 slices shown (1 of 2)]
[im 1/144]
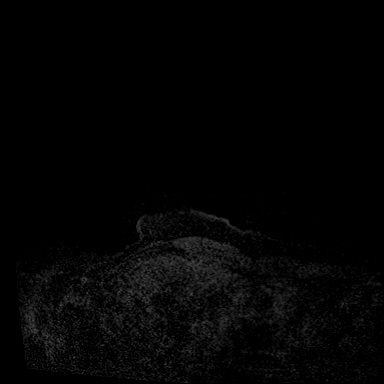
[im 48/144]
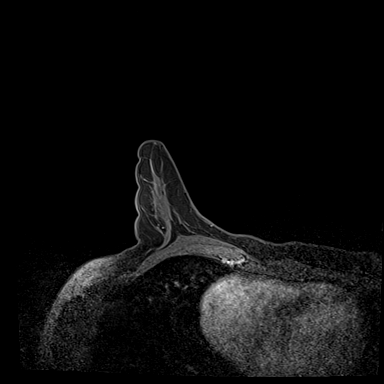
[im 96/144]
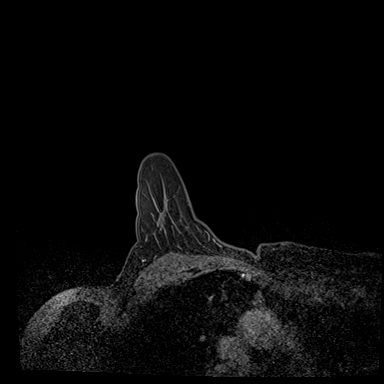
[im 144/144]
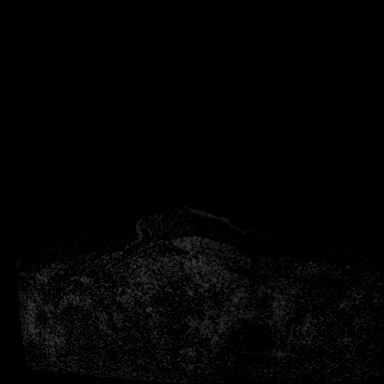

[Series 5: dynamic post 20 · axial · 1.3mm · 0.73mm/px · z∈[-92,+93]mm · 4 of 144 slices shown (2 of 2)]
[im 1/144]
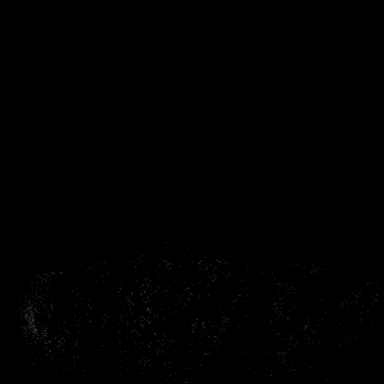
[im 48/144]
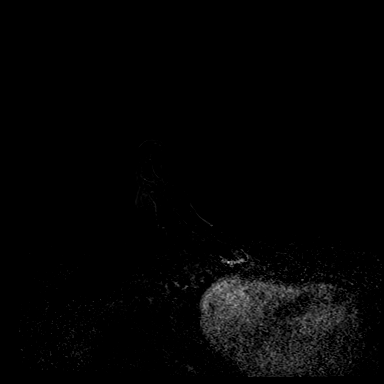
[im 96/144]
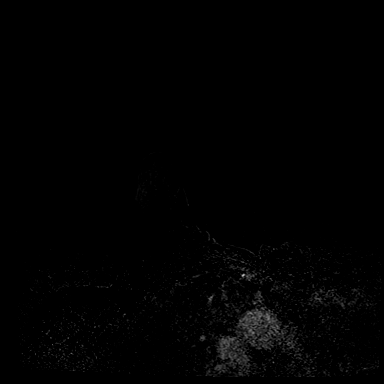
[im 144/144]
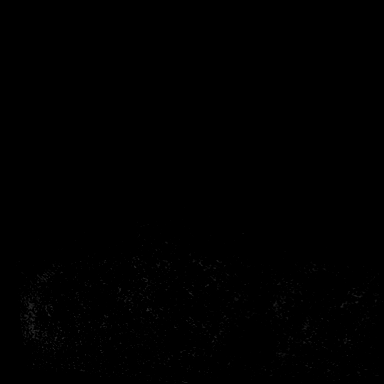

[Series 6: dynamic post 3 · axial · 1.3mm · 0.73mm/px · z∈[-92,+93]mm · 4 of 144 slices shown (1 of 2)]
[im 1/144]
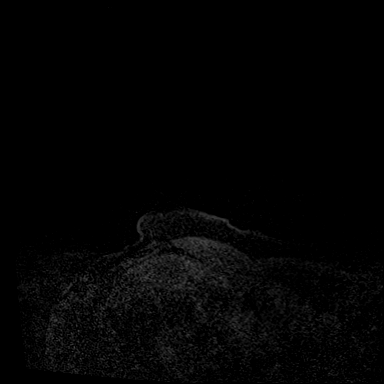
[im 48/144]
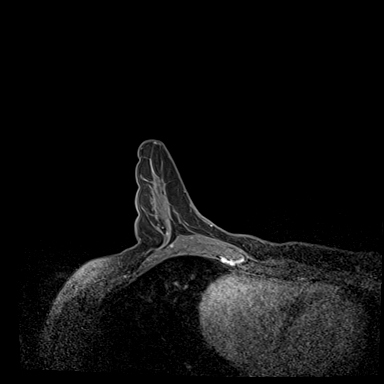
[im 96/144]
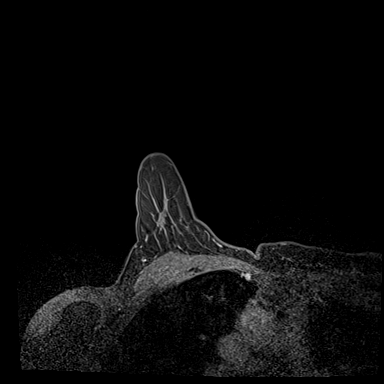
[im 144/144]
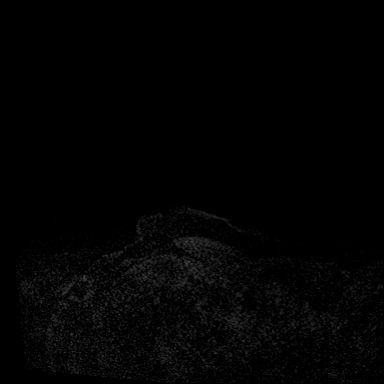

[Series 7: dynamic post 3 · axial · 1.3mm · 0.73mm/px · z∈[-92,+93]mm · 4 of 144 slices shown (2 of 2)]
[im 1/144]
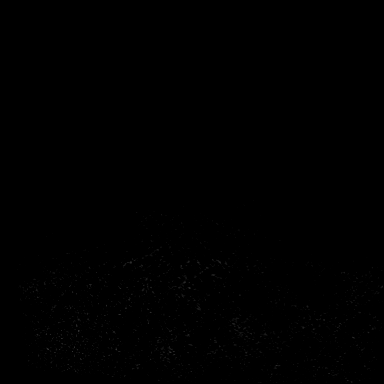
[im 48/144]
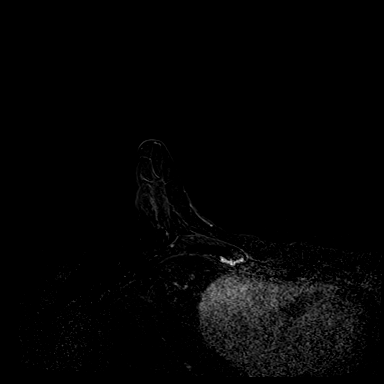
[im 96/144]
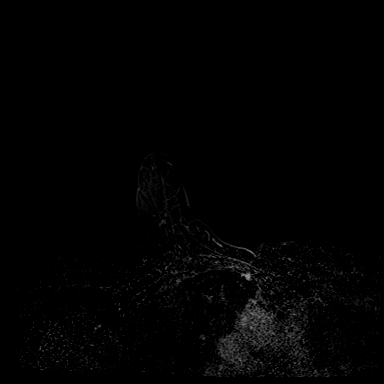
[im 144/144]
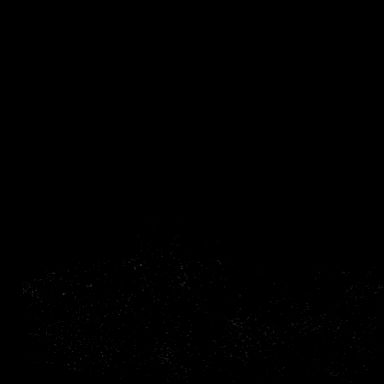

[Series 8: needle confirmation · axial · 1.3mm · 0.73mm/px · z∈[-92,+93]mm · 4 of 144 slices shown]
[im 1/144]
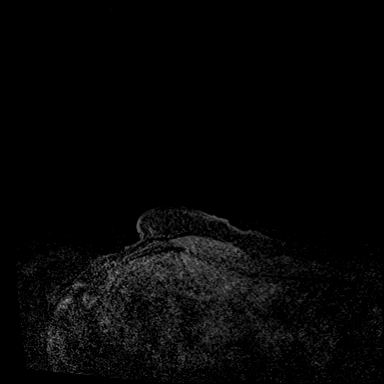
[im 48/144]
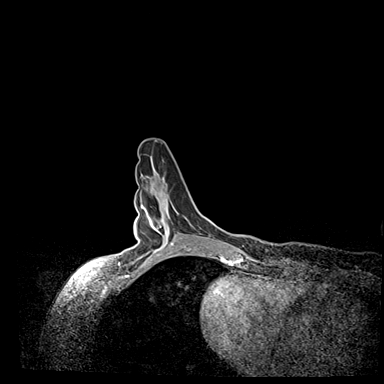
[im 96/144]
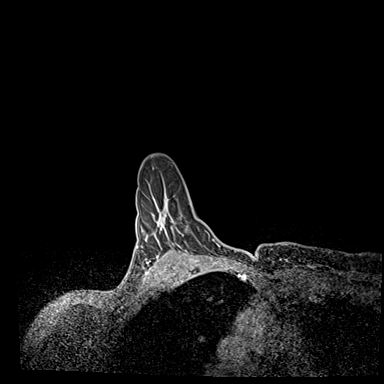
[im 144/144]
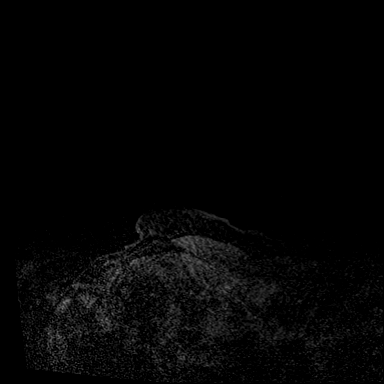

[Series 9: needle confirmation_sub · axial · 1.3mm · 0.73mm/px · z∈[-92,+93]mm · 4 of 144 slices shown]
[im 1/144]
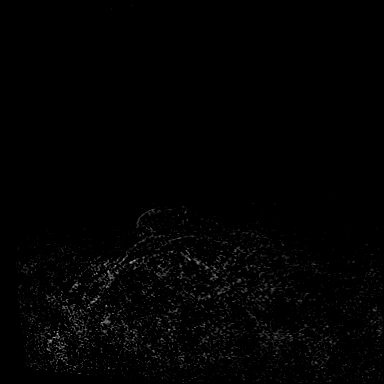
[im 48/144]
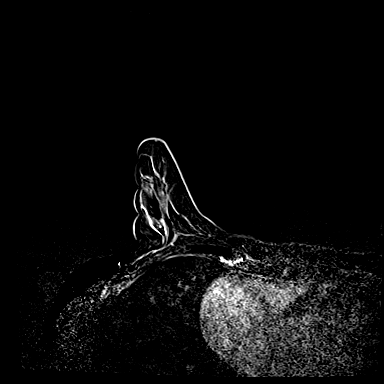
[im 96/144]
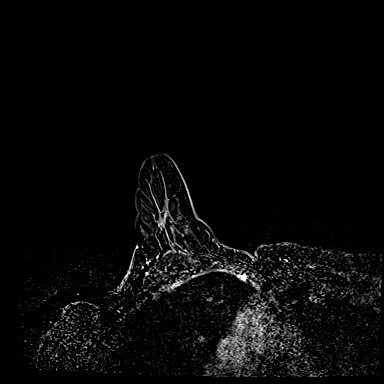
[im 144/144]
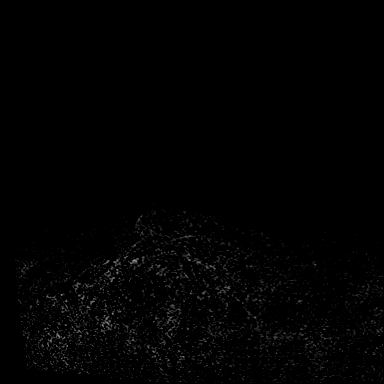

[Series 10: post bx · axial · 1.3mm · 0.73mm/px · 1 of 144 slices shown]
[im 1/144]
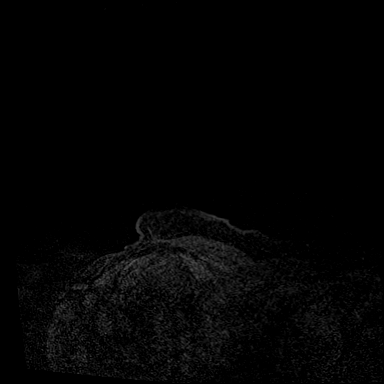

[33 of 48 positions shown; findings below may reference images not displayed]

FINDINGS: I met with the patient, and we discussed the procedure of MRI guided
biopsy, including risks, benefits, and alternatives. Specifically,
we discussed the risks of infection, bleeding, tissue injury, clip
migration, and inadequate sampling. Informed, written consent was
given. The usual time out protocol was performed immediately prior
to the procedure.

Using sterile technique, 1% Lidocaine, MRI guidance, and a 9 gauge
vacuum assisted device, biopsy was performed of enhancement in the
lower central aspect of the right breast using a lateral to medial
approach. At the conclusion of the procedure, a cylindrical shaped
tissue marker clip was deployed into the biopsy cavity. Follow-up
2-view mammogram was performed and dictated separately.
IMPRESSION: MRI guided biopsy of the right breast.  No apparent complications.

ADDENDUM:
Pathology revealed FIBROCYSTIC CHANGES INCLUDING APOCRINE
METAPLASIA- NO MALIGNANCY IDENTIFIED of the RIGHT breast, lower
central (cylinder clip). This was found to be concordant by Dr. MARTIL
MARTIL.

Pathology results were discussed with the patient by telephone. The
patient reported doing well after the biopsy with tenderness at the
site. Post biopsy instructions and care were reviewed and questions
were answered. The patient was encouraged to call The [REDACTED]

Bilateral breast MRI recommended in 6 months per protocol.

Pathology results reported by MARTIL RN on [DATE].

*** End of Addendum ***
FINDINGS: I met with the patient, and we discussed the procedure of MRI guided
biopsy, including risks, benefits, and alternatives. Specifically,
we discussed the risks of infection, bleeding, tissue injury, clip
migration, and inadequate sampling. Informed, written consent was
given. The usual time out protocol was performed immediately prior
to the procedure.

Using sterile technique, 1% Lidocaine, MRI guidance, and a 9 gauge
vacuum assisted device, biopsy was performed of enhancement in the
lower central aspect of the right breast using a lateral to medial
approach. At the conclusion of the procedure, a cylindrical shaped
tissue marker clip was deployed into the biopsy cavity. Follow-up
2-view mammogram was performed and dictated separately.
IMPRESSION: MRI guided biopsy of the right breast.  No apparent complications.

## 2022-03-24 IMAGING — MG MM BREAST LOCALIZATION CLIP
4 series · 4 of 12 positions shown · non-contrast
Comparison: Previous exam(s).

CLINICAL DATA: Status post MR guided core biopsy of the right
breast.

EXAM:
3D DIAGNOSTIC RIGHT MAMMOGRAM POST MRI BIOPSY

[R CC synth-2D]
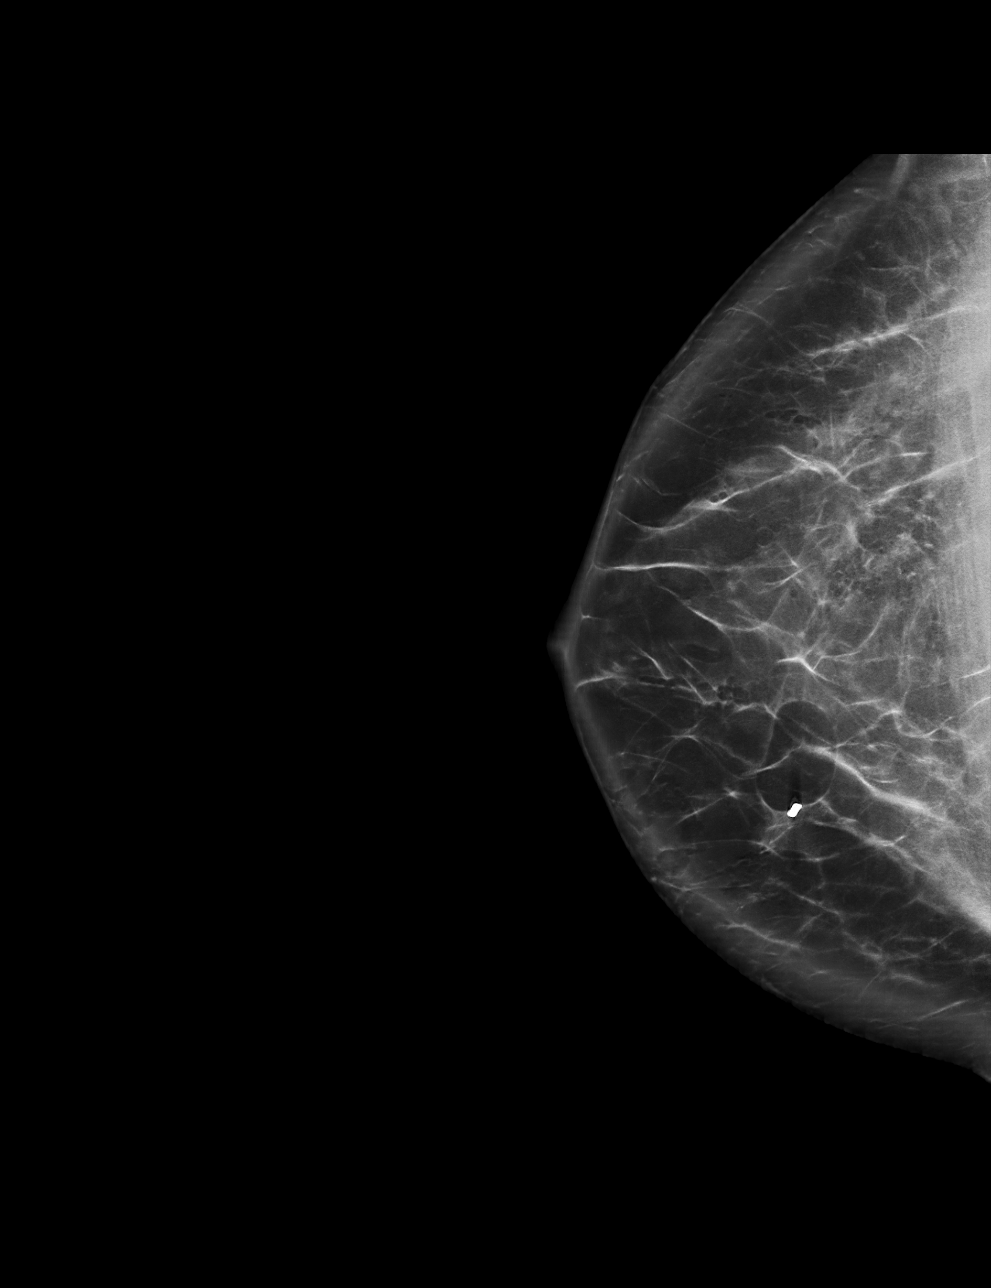

[R ML synth-2D]
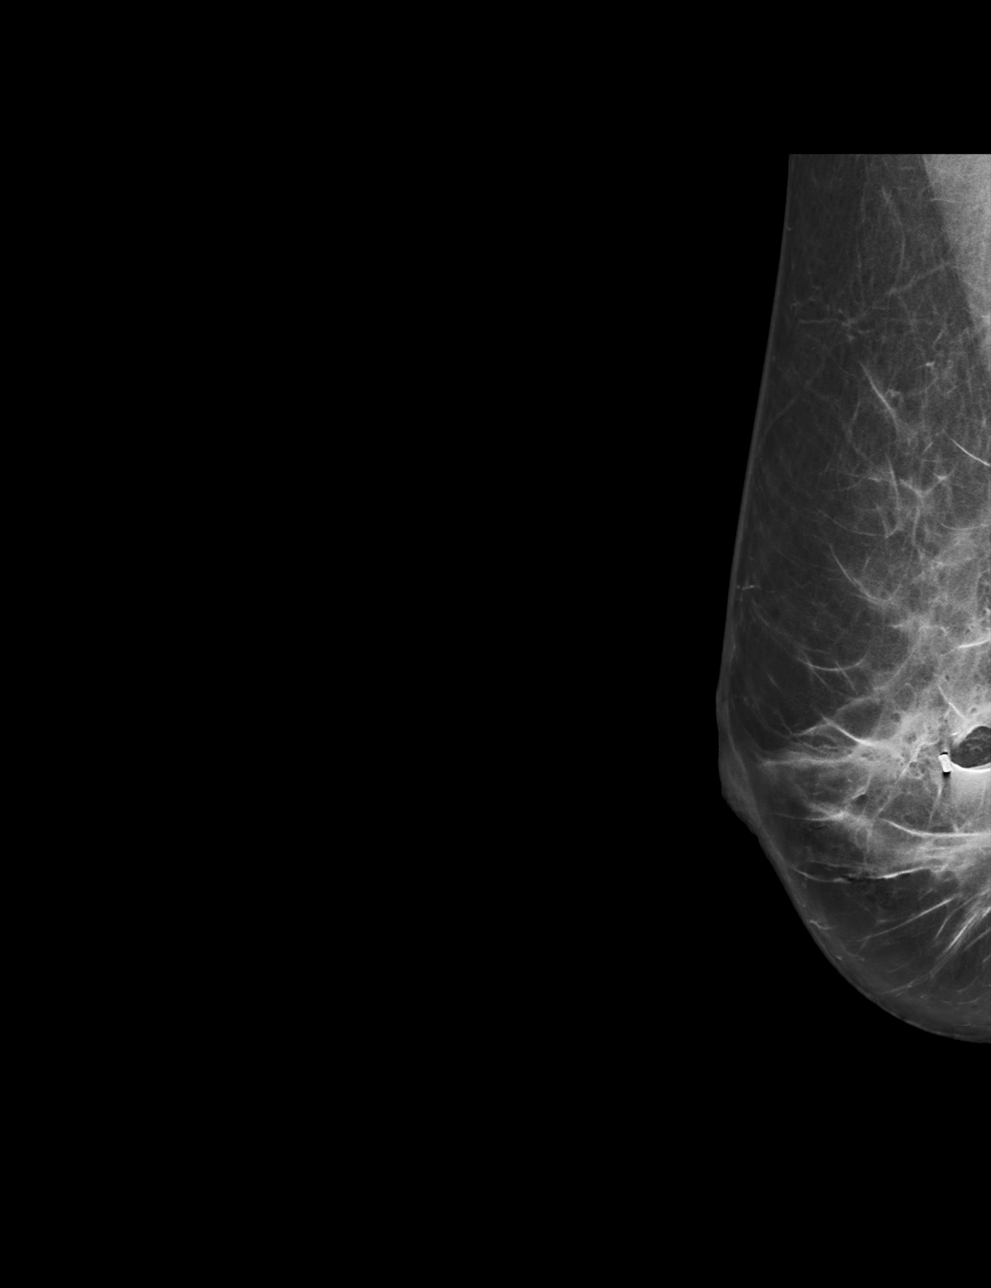

[R CC tomo · tomo slice 39/76.0]
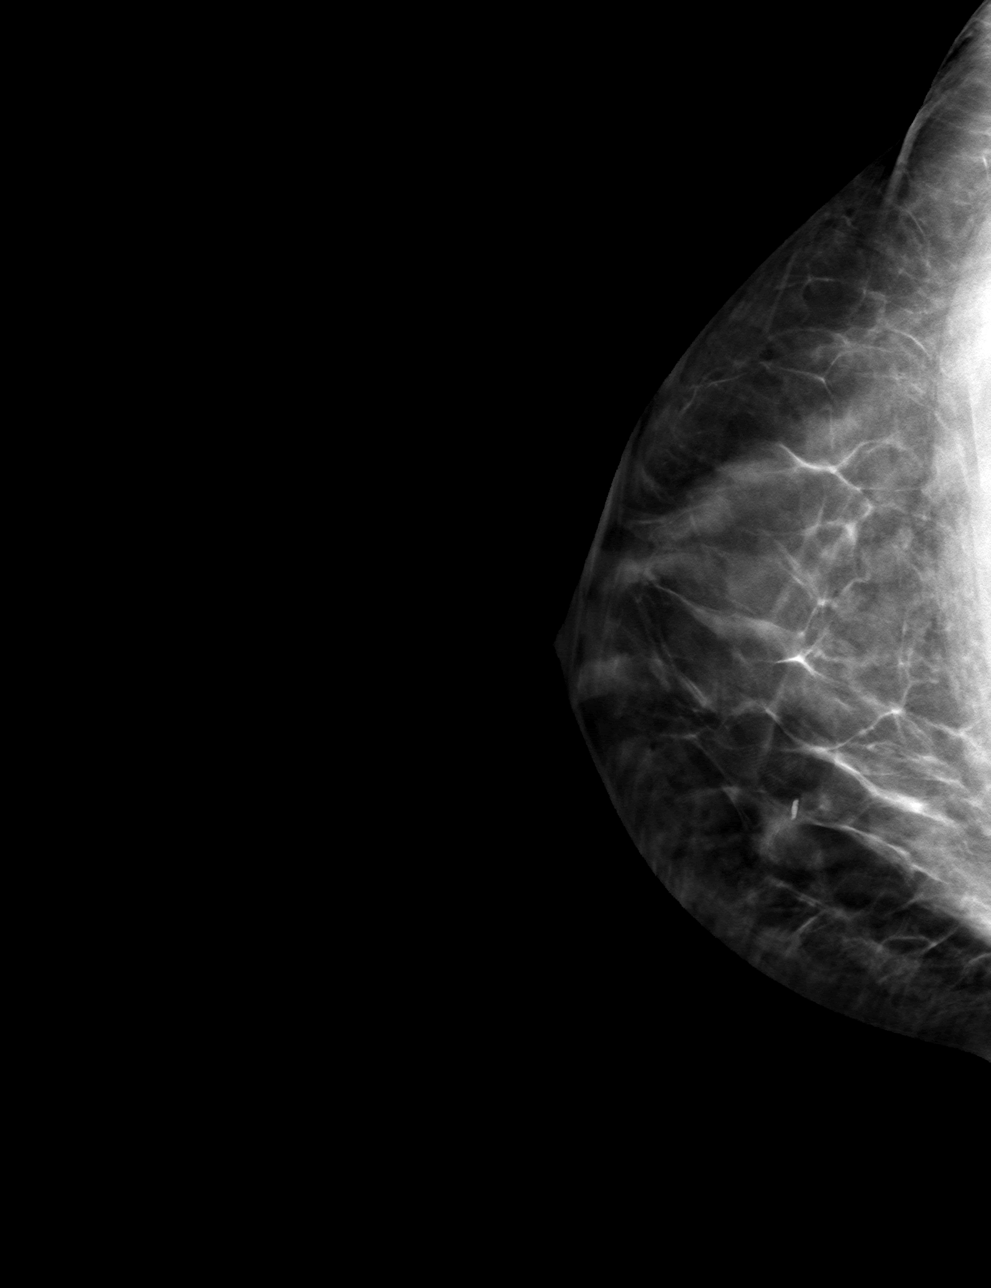

[R ML tomo · tomo slice 33/66.0]
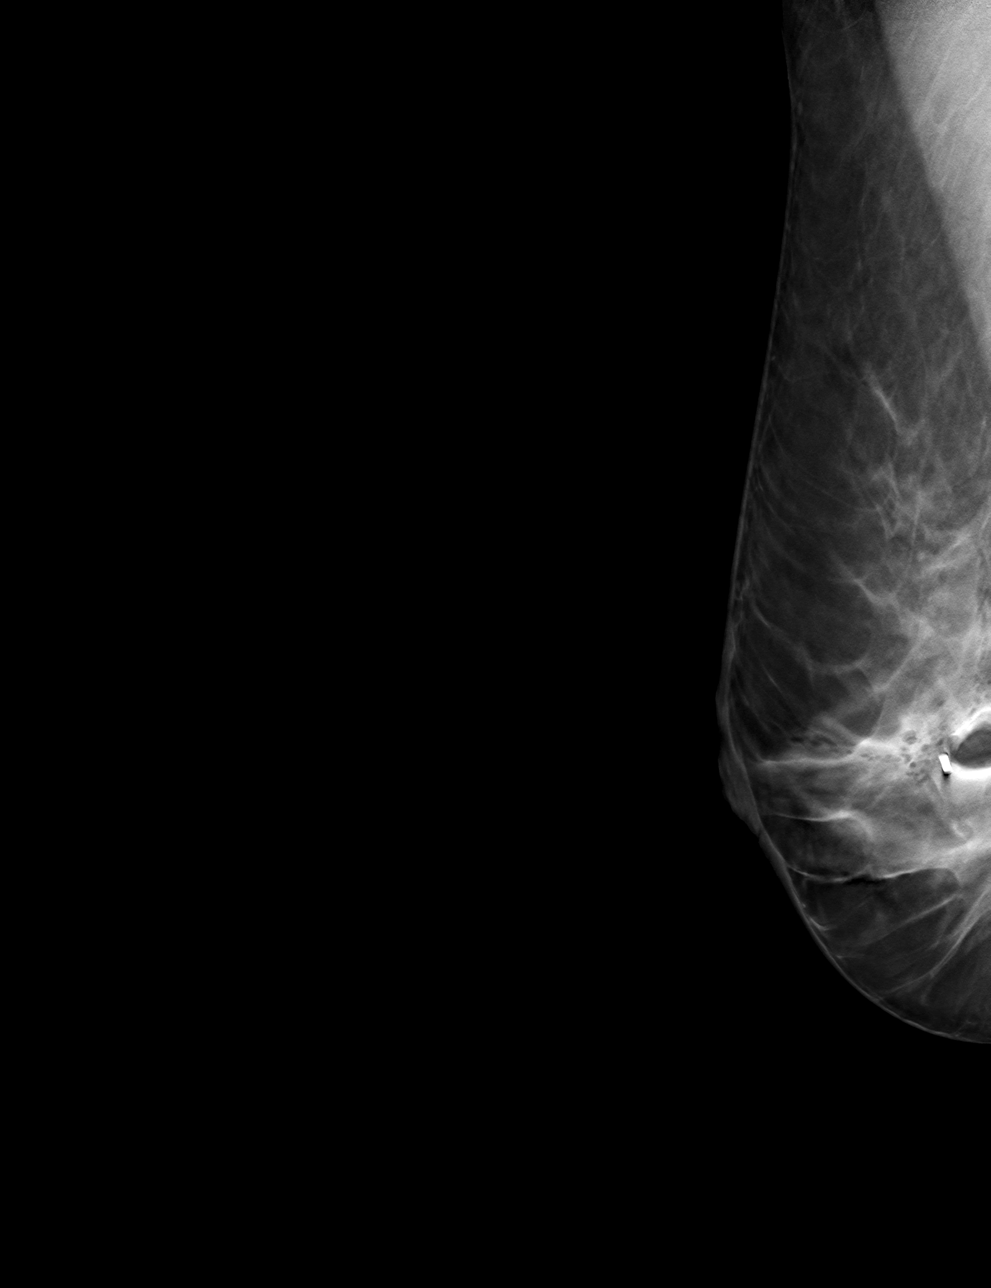

[4 of 12 positions shown; findings below may reference images not displayed]

FINDINGS: 3D Mammographic images were obtained following MR guided biopsy of
the right breast. The biopsy marking clip is thought to be migrated
approximately 1.5 cm medial to the biopsied area.
IMPRESSION: Status post MR guided core biopsy of the right breast. The
cylindrical shaped biopsy marker clip is probably migrated
approximately 1.5 cm medial to the biopsied area of enhancement.

Final Assessment: Post Procedure Mammograms for Marker Placement

## 2022-03-24 MED ORDER — GADOBUTROL 1 MMOL/ML IV SOLN
7.0000 mL | Freq: Once | INTRAVENOUS | Status: AC | PRN
  Administered 2022-03-24: 7 mL via INTRAVENOUS

## 2022-04-15 ENCOUNTER — Ambulatory Visit (INDEPENDENT_AMBULATORY_CARE_PROVIDER_SITE_OTHER): Payer: BC Managed Care – PPO | Admitting: Vascular Surgery

## 2022-05-20 ENCOUNTER — Ambulatory Visit (INDEPENDENT_AMBULATORY_CARE_PROVIDER_SITE_OTHER): Payer: BC Managed Care – PPO | Admitting: Vascular Surgery

## 2022-05-26 NOTE — Progress Notes (Signed)
   Indication:  Patient presents with symptomatic varicose veins of the left lower extremity.  Procedure:  Sclerotherapy using hypertonic saline mixed with 1% Lidocaine was performed on the left lower extremity.  Compression wraps were placed.  The patient tolerated the procedure well.  Plan:  Follow up as needed.   

## 2022-05-27 ENCOUNTER — Encounter (INDEPENDENT_AMBULATORY_CARE_PROVIDER_SITE_OTHER): Payer: Self-pay | Admitting: Vascular Surgery

## 2022-05-27 ENCOUNTER — Ambulatory Visit (INDEPENDENT_AMBULATORY_CARE_PROVIDER_SITE_OTHER): Payer: BC Managed Care – PPO | Admitting: Vascular Surgery

## 2022-05-27 VITALS — BP 142/72 | HR 63 | Resp 16 | Wt 167.6 lb

## 2022-05-27 DIAGNOSIS — I83819 Varicose veins of unspecified lower extremities with pain: Secondary | ICD-10-CM

## 2022-05-27 DIAGNOSIS — I83812 Varicose veins of left lower extremities with pain: Secondary | ICD-10-CM | POA: Diagnosis not present

## 2022-05-28 ENCOUNTER — Encounter (INDEPENDENT_AMBULATORY_CARE_PROVIDER_SITE_OTHER): Payer: Self-pay | Admitting: Vascular Surgery

## 2022-06-09 DIAGNOSIS — J302 Other seasonal allergic rhinitis: Secondary | ICD-10-CM | POA: Diagnosis not present

## 2022-06-09 DIAGNOSIS — J3089 Other allergic rhinitis: Secondary | ICD-10-CM | POA: Diagnosis not present

## 2022-06-09 DIAGNOSIS — M4726 Other spondylosis with radiculopathy, lumbar region: Secondary | ICD-10-CM | POA: Diagnosis not present

## 2022-06-09 DIAGNOSIS — Z79899 Other long term (current) drug therapy: Secondary | ICD-10-CM | POA: Diagnosis not present

## 2022-06-09 DIAGNOSIS — Z131 Encounter for screening for diabetes mellitus: Secondary | ICD-10-CM | POA: Diagnosis not present

## 2022-06-09 DIAGNOSIS — Z1329 Encounter for screening for other suspected endocrine disorder: Secondary | ICD-10-CM | POA: Diagnosis not present

## 2022-06-09 DIAGNOSIS — Z0001 Encounter for general adult medical examination with abnormal findings: Secondary | ICD-10-CM | POA: Diagnosis not present

## 2022-06-09 DIAGNOSIS — Z1321 Encounter for screening for nutritional disorder: Secondary | ICD-10-CM | POA: Diagnosis not present

## 2022-06-09 DIAGNOSIS — Z23 Encounter for immunization: Secondary | ICD-10-CM | POA: Diagnosis not present

## 2022-06-09 DIAGNOSIS — Z13220 Encounter for screening for lipoid disorders: Secondary | ICD-10-CM | POA: Diagnosis not present

## 2022-06-10 DIAGNOSIS — E559 Vitamin D deficiency, unspecified: Secondary | ICD-10-CM | POA: Insufficient documentation

## 2022-06-23 DIAGNOSIS — M4180 Other forms of scoliosis, site unspecified: Secondary | ICD-10-CM | POA: Diagnosis not present

## 2022-06-23 DIAGNOSIS — M5416 Radiculopathy, lumbar region: Secondary | ICD-10-CM | POA: Insufficient documentation

## 2022-06-23 DIAGNOSIS — M47816 Spondylosis without myelopathy or radiculopathy, lumbar region: Secondary | ICD-10-CM | POA: Diagnosis not present

## 2022-06-24 ENCOUNTER — Encounter (INDEPENDENT_AMBULATORY_CARE_PROVIDER_SITE_OTHER): Payer: Self-pay | Admitting: Vascular Surgery

## 2022-06-24 ENCOUNTER — Ambulatory Visit (INDEPENDENT_AMBULATORY_CARE_PROVIDER_SITE_OTHER): Payer: BC Managed Care – PPO | Admitting: Vascular Surgery

## 2022-06-24 VITALS — BP 117/68 | HR 69 | Resp 16 | Wt 167.4 lb

## 2022-06-24 DIAGNOSIS — I83819 Varicose veins of unspecified lower extremities with pain: Secondary | ICD-10-CM | POA: Diagnosis not present

## 2022-06-25 ENCOUNTER — Encounter (INDEPENDENT_AMBULATORY_CARE_PROVIDER_SITE_OTHER): Payer: Self-pay | Admitting: Vascular Surgery

## 2022-06-25 NOTE — Progress Notes (Signed)
Indication:  Patient presents with symptomatic varicose veins of the right lower extremity.  Procedure:  Sclerotherapy using hypertonic saline mixed with 1% Lidocaine was performed on the right lower extremity.  Compression wraps were placed.  The patient tolerated the procedure well.  Plan:  Follow up as needed.   

## 2022-07-12 DIAGNOSIS — M5416 Radiculopathy, lumbar region: Secondary | ICD-10-CM | POA: Diagnosis not present

## 2022-07-21 NOTE — Progress Notes (Signed)
   Indication:  Patient presents with symptomatic varicose veins of the bilateral lower extremity.  Procedure:  Sclerotherapy using hypertonic saline mixed with 1% Lidocaine was performed on the bilateral lower extremity.  Compression wraps were placed.  The patient tolerated the procedure well.  Plan:  Follow up as needed.  

## 2022-07-22 ENCOUNTER — Encounter (INDEPENDENT_AMBULATORY_CARE_PROVIDER_SITE_OTHER): Payer: Self-pay | Admitting: Vascular Surgery

## 2022-07-22 ENCOUNTER — Ambulatory Visit (INDEPENDENT_AMBULATORY_CARE_PROVIDER_SITE_OTHER): Payer: BC Managed Care – PPO | Admitting: Vascular Surgery

## 2022-07-22 VITALS — BP 137/72 | HR 64 | Resp 17 | Ht 68.0 in | Wt 168.6 lb

## 2022-07-22 DIAGNOSIS — I83819 Varicose veins of unspecified lower extremities with pain: Secondary | ICD-10-CM | POA: Diagnosis not present

## 2022-08-09 DIAGNOSIS — M5416 Radiculopathy, lumbar region: Secondary | ICD-10-CM | POA: Diagnosis not present

## 2022-08-09 DIAGNOSIS — M47816 Spondylosis without myelopathy or radiculopathy, lumbar region: Secondary | ICD-10-CM | POA: Diagnosis not present

## 2022-08-16 ENCOUNTER — Other Ambulatory Visit: Payer: Self-pay | Admitting: General Surgery

## 2022-08-16 DIAGNOSIS — Z803 Family history of malignant neoplasm of breast: Secondary | ICD-10-CM

## 2022-08-16 DIAGNOSIS — Z1239 Encounter for other screening for malignant neoplasm of breast: Secondary | ICD-10-CM

## 2022-08-23 ENCOUNTER — Telehealth: Payer: Self-pay | Admitting: *Deleted

## 2022-08-23 NOTE — Patient Outreach (Signed)
  Care Coordination   08/23/2022 Name: Ila Landowski MRN: [id number] DOB: Feb 14, 1966   Care Coordination Outreach Attempts:  An unsuccessful telephone outreach was attempted today to offer the patient information about available care coordination services as a benefit of their health plan.   Follow Up Plan:  Additional outreach attempts will be made to offer the patient care coordination information and services.   Encounter Outcome:  No Answer  Care Coordination Interventions Activated:  No   Care Coordination Interventions:  No, not indicated    Jacqlyn Larsen [facility], Newman RN Care Coordinator [phone]

## 2022-08-24 ENCOUNTER — Encounter: Payer: Self-pay | Admitting: *Deleted

## 2022-08-24 ENCOUNTER — Telehealth: Payer: Self-pay | Admitting: *Deleted

## 2022-08-24 NOTE — Patient Outreach (Signed)
  Care Coordination   Initial Visit Note   08/24/2022 Name: Canary Fister MRN: [id number] DOB: 03/02/1966  Nyisha Clippard is a 56 y.o. year old female who sees Rod Can, CNM for primary care. I spoke with  Olivia Mackie Roussel by phone today.  What matters to the patients health and wellness today? "to stay as healthy as possible"    Goals Addressed               This Visit's Progress     COMPLETED: "to stay as healthy as possible" (pt-stated)        Care Coordination Interventions: Patient interviewed about adult health maintenance status including  importance of yearly Annual Wellness Visit, pt reports she does complete AWV yearly Provided education about importance of taking medications as prescribed, attending all scheduled appointments. Care coordination program explained, patient agreeable to today's outreach but declines further outreach Patient reports she is independent, lives with spouse          SDOH assessments and interventions completed:  Yes  SDOH Interventions Today    Flowsheet Row Most Recent Value  SDOH Interventions   Food Insecurity Interventions Intervention Not Indicated  Transportation Interventions Intervention Not Indicated        Care Coordination Interventions Activated:  Yes  Care Coordination Interventions:  Yes, provided   Follow up plan: No further intervention required.   Encounter Outcome:  Pt. Visit Completed   Jacqlyn Larsen [facility], BSN [facility] RN Care Coordinator [phone]

## 2022-08-30 DIAGNOSIS — M5416 Radiculopathy, lumbar region: Secondary | ICD-10-CM | POA: Diagnosis not present

## 2022-09-10 DIAGNOSIS — E559 Vitamin D deficiency, unspecified: Secondary | ICD-10-CM | POA: Diagnosis not present

## 2022-09-10 DIAGNOSIS — Z79899 Other long term (current) drug therapy: Secondary | ICD-10-CM | POA: Diagnosis not present

## 2022-09-10 DIAGNOSIS — E7800 Pure hypercholesterolemia, unspecified: Secondary | ICD-10-CM | POA: Insufficient documentation

## 2022-09-10 DIAGNOSIS — J3089 Other allergic rhinitis: Secondary | ICD-10-CM | POA: Diagnosis not present

## 2022-09-10 DIAGNOSIS — J302 Other seasonal allergic rhinitis: Secondary | ICD-10-CM | POA: Insufficient documentation

## 2022-09-13 ENCOUNTER — Encounter (INDEPENDENT_AMBULATORY_CARE_PROVIDER_SITE_OTHER): Payer: Self-pay

## 2022-09-21 DIAGNOSIS — R202 Paresthesia of skin: Secondary | ICD-10-CM | POA: Diagnosis not present

## 2022-09-21 DIAGNOSIS — M47816 Spondylosis without myelopathy or radiculopathy, lumbar region: Secondary | ICD-10-CM | POA: Diagnosis not present

## 2022-09-28 ENCOUNTER — Other Ambulatory Visit: Payer: BC Managed Care – PPO

## 2022-10-04 ENCOUNTER — Encounter: Payer: Self-pay | Admitting: General Surgery

## 2022-10-06 ENCOUNTER — Ambulatory Visit
Admission: RE | Admit: 2022-10-06 | Discharge: 2022-10-06 | Disposition: A | Discharge status: Home / Self Care | Payer: BC Managed Care – PPO | Source: Ambulatory Visit | Attending: General Surgery | Admitting: General Surgery

## 2022-10-06 DIAGNOSIS — N6489 Other specified disorders of breast: Secondary | ICD-10-CM | POA: Diagnosis not present

## 2022-10-06 DIAGNOSIS — Z1239 Encounter for other screening for malignant neoplasm of breast: Secondary | ICD-10-CM

## 2022-10-06 DIAGNOSIS — Z803 Family history of malignant neoplasm of breast: Secondary | ICD-10-CM

## 2022-10-06 MED ORDER — GADOPICLENOL 0.5 MMOL/ML IV SOLN
8.0000 mL | Freq: Once | INTRAVENOUS | Status: AC | PRN
  Administered 2022-10-06: 8 mL via INTRAVENOUS

## 2022-10-18 DIAGNOSIS — M47816 Spondylosis without myelopathy or radiculopathy, lumbar region: Secondary | ICD-10-CM | POA: Diagnosis not present

## 2023-02-21 ENCOUNTER — Other Ambulatory Visit (HOSPITAL_COMMUNITY): Payer: Self-pay | Admitting: General Surgery

## 2023-02-21 DIAGNOSIS — Z803 Family history of malignant neoplasm of breast: Secondary | ICD-10-CM

## 2023-02-21 DIAGNOSIS — Z1239 Encounter for other screening for malignant neoplasm of breast: Secondary | ICD-10-CM

## 2023-03-02 ENCOUNTER — Ambulatory Visit (HOSPITAL_COMMUNITY)
Admission: RE | Admit: 2023-03-02 | Discharge: 2023-03-02 | Disposition: A | Discharge status: Home / Self Care | Payer: BC Managed Care – PPO | Source: Ambulatory Visit | Attending: General Surgery | Admitting: General Surgery

## 2023-03-02 DIAGNOSIS — Z1239 Encounter for other screening for malignant neoplasm of breast: Secondary | ICD-10-CM | POA: Insufficient documentation

## 2023-03-02 DIAGNOSIS — Z803 Family history of malignant neoplasm of breast: Secondary | ICD-10-CM | POA: Insufficient documentation

## 2023-03-02 MED ORDER — GADOBUTROL 1 MMOL/ML IV SOLN
7.0000 mL | Freq: Once | INTRAVENOUS | Status: AC | PRN
  Administered 2023-03-02: 7 mL via INTRAVENOUS

## 2023-11-18 ENCOUNTER — Other Ambulatory Visit: Payer: Self-pay | Admitting: General Surgery

## 2023-11-18 DIAGNOSIS — Z803 Family history of malignant neoplasm of breast: Secondary | ICD-10-CM

## 2023-11-18 DIAGNOSIS — Z1239 Encounter for other screening for malignant neoplasm of breast: Secondary | ICD-10-CM

## 2023-11-24 ENCOUNTER — Telehealth: Payer: Self-pay

## 2023-11-24 ENCOUNTER — Other Ambulatory Visit (HOSPITAL_COMMUNITY)
Admission: RE | Admit: 2023-11-24 | Discharge: 2023-11-24 | Disposition: A | Discharge status: Home / Self Care | Payer: BC Managed Care – PPO | Source: Ambulatory Visit | Attending: Advanced Practice Midwife | Admitting: Advanced Practice Midwife

## 2023-11-24 ENCOUNTER — Other Ambulatory Visit: Payer: Self-pay

## 2023-11-24 ENCOUNTER — Ambulatory Visit (INDEPENDENT_AMBULATORY_CARE_PROVIDER_SITE_OTHER): Payer: BC Managed Care – PPO | Admitting: Advanced Practice Midwife

## 2023-11-24 ENCOUNTER — Encounter: Payer: Self-pay | Admitting: Advanced Practice Midwife

## 2023-11-24 VITALS — BP 119/58 | HR 74 | Ht 68.0 in | Wt 163.6 lb

## 2023-11-24 DIAGNOSIS — Z124 Encounter for screening for malignant neoplasm of cervix: Secondary | ICD-10-CM | POA: Insufficient documentation

## 2023-11-24 DIAGNOSIS — Z01419 Encounter for gynecological examination (general) (routine) without abnormal findings: Secondary | ICD-10-CM | POA: Diagnosis present

## 2023-11-24 DIAGNOSIS — Z1211 Encounter for screening for malignant neoplasm of colon: Secondary | ICD-10-CM

## 2023-11-24 DIAGNOSIS — Z1239 Encounter for other screening for malignant neoplasm of breast: Secondary | ICD-10-CM

## 2023-11-24 MED ORDER — NA SULFATE-K SULFATE-MG SULF 17.5-3.13-1.6 GM/177ML PO SOLN: 1.0000 | Freq: Once | ORAL | 0 refills | Status: AC

## 2023-11-24 NOTE — Telephone Encounter (Signed)
 Gastroenterology Pre-Procedure Review  Request Date: 01/06/24 Requesting Physician: Dr. Unk  PATIENT REVIEW QUESTIONS: The patient responded to the following health history questions as indicated:    1. Are you having any GI issues? no 2. Do you have a personal history of Polyps? no 3. Do you have a family history of Colon Cancer or Polyps? no 4. Diabetes Mellitus? no 5. Joint replacements in the past 12 months?no 6. Major health problems in the past 3 months?no 7. Any artificial heart valves, MVP, or defibrillator?no    MEDICATIONS & ALLERGIES:    Patient reports the following regarding taking any anticoagulation/antiplatelet therapy:   Plavix, Coumadin, Eliquis, Xarelto, Lovenox, Pradaxa, Brilinta, or Effient? no Aspirin? no  Patient confirms/reports the following medications:  Current Outpatient Medications  Medication Sig Dispense Refill   Cholecalciferol 1.25 MG (50000 UT) capsule Take by mouth.     montelukast (SINGULAIR) 10 MG tablet Take by mouth.     No current facility-administered medications for this visit.    Patient confirms/reports the following allergies:  Allergies  Allergen Reactions   Shellfish Allergy Hives    No orders of the defined types were placed in this encounter.   AUTHORIZATION INFORMATION Primary Insurance: 1D#: Group #:  Secondary Insurance: 1D#: Group #:  SCHEDULE INFORMATION: Date: 01/06/24 Time: Location: [facility]

## 2023-11-24 NOTE — Progress Notes (Signed)
 Gynecology Annual Exam  PCP: Lynda Bradley, CNM  Chief Complaint:  Chief Complaint  Patient presents with   Gynecologic Exam    History of Present Illness: Patient is a 58 y.o. G1P1001 presents for annual exam. The patient has no gyn complaints today.   LMP: No LMP recorded. Patient has had an ablation.   The patient is sexually active. She denies dyspareunia.  The patient does perform self breast exams.  There is notable family history of breast or ovarian cancer in her family. See note from 2021 visit.  The patient wears seatbelts: yes.   The patient has regular exercise: she works out daily. She reports healthy diet, adequate hydration and about 5-7 hours of sleep.    The patient denies current symptoms of depression.    Review of Systems: Review of Systems  Constitutional:  Negative for chills and fever.  HENT:  Negative for congestion, ear discharge, ear pain, hearing loss, sinus pain and sore throat.   Eyes:  Negative for blurred vision and double vision.  Respiratory:  Negative for cough, shortness of breath and wheezing.   Cardiovascular:  Negative for chest pain, palpitations and leg swelling.  Gastrointestinal:  Negative for abdominal pain, blood in stool, constipation, diarrhea, heartburn, melena, nausea and vomiting.  Genitourinary:  Negative for dysuria, flank pain, frequency, hematuria and urgency.  Musculoskeletal:  Negative for back pain, joint pain and myalgias.  Skin:  Negative for itching and rash.  Neurological:  Negative for dizziness, tingling, tremors, sensory change, speech change, focal weakness, seizures, loss of consciousness, weakness and headaches.  Endo/Heme/Allergies:  Negative for environmental allergies. Does not bruise/bleed easily.  Psychiatric/Behavioral:  Negative for depression, hallucinations, memory loss, substance abuse and suicidal ideas. The patient is not nervous/anxious and does not have insomnia.     Past Medical History:  Patient  Active Problem List   Diagnosis Date Noted Date Diagnosed   Seasonal allergies 09/10/2022    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    History of endometrial ablation 09/05/2017     Past Surgical History:  Past Surgical History:  Procedure Laterality Date   ABLATION Bilateral 2007   BREAST BIOPSY Left    benign   BREAST BIOPSY Left    benign   BREAST BIOPSY Right 07/13/2021   u/s bx axilla hrdro coil shape 3 path pending   CESAREAN SECTION     TUBAL LIGATION      Gynecologic History:  No LMP recorded. Patient has had an ablation.  Last Pap: 2021 Results were:  no abnormalities  Last mammogram: 2024 Results were: BI-RAD I  Obstetric History: G1P1001  Family History:  Family History  Problem Relation Age of Onset   Breast cancer Mother    Breast cancer Sister    Prostate cancer Brother 45   Prostate cancer Brother 37   Breast cancer Maternal Grandmother    Breast cancer Other     Social History:  Social History   Socioeconomic History   Marital status: Married    Spouse name: Not on file   Number of children: Not on file   Years of education: Not on file   Highest education level: Not on file  Occupational History   Not on file  Tobacco Use   Smoking status: Never   Smokeless tobacco: Never  Vaping Use   Vaping status: Never Used  Substance and Sexual Activity   Alcohol use: Yes    Comment: occ   Drug use: Never   Sexual  activity: Yes    Birth control/protection: None  Other Topics Concern   Not on file  Social History Narrative   Not on file   Social Drivers of Health   Financial Resource Strain: Low Risk  (08/06/2022)   Received from [facility], [facility]   Overall Financial Resource Strain (CARDIA)    Difficulty of Paying Living Expenses: Not hard at all  Food Insecurity: No Food Insecurity (08/24/2022)   Hunger Vital Sign    Worried About Running Out of Food in the Last Year: Never true    Ran Out of Food in the Last Year: Never true   Transportation Needs: No Transportation Needs (08/24/2022)   PRAPARE - Administrator, Civil Service (Medical): No    Lack of Transportation (Non-Medical): No  Physical Activity: Insufficiently Active (08/06/2022)   Received from [facility], [facility]   Exercise Vital Sign    Days of Exercise per Week: 3 days    Minutes of Exercise per Session: 40 min  Stress: Stress Concern Present (08/06/2022)   Received from [facility], [facility] of Occupational Health - Occupational Stress Questionnaire    Feeling of Stress : To some extent  Social Connections: Unknown (08/10/2023)   Received from [facility]   Social Network    Social Network: Not on file  Intimate Partner Violence: Unknown (08/10/2023)   Received from [facility]   HITS    Physically Hurt: Not on file    Insult or Talk Down To: Not on file    Threaten Physical Harm: Not on file    Scream or Curse: Not on file    Allergies:  Allergies  Allergen Reactions   Shellfish Allergy Hives    Medications: Prior to Admission medications   Medication Sig Start Date End Date Taking? Authorizing Provider  Cholecalciferol 1.25 MG (50000 UT) capsule Take by mouth. 07/06/22  Yes [provider]  montelukast (SINGULAIR) 10 MG tablet Take by mouth. 09/10/22  Yes [provider]  Na Sulfate-K Sulfate-Mg Sulf 17.5-3.13-1.6 GM/177ML SOLN Take 1 kit by mouth once for 1 dose. 11/24/23 11/24/23  Unk Corinn Skiff, MD    Physical Exam Vitals: Blood pressure (!) 119/58, pulse 74, height 5' 8 (1.727 m), weight 163 lb 9.6 oz (74.2 kg).  General: NAD HEENT: normocephalic, anicteric Thyroid: no enlargement, no palpable nodules Pulmonary: No increased work of breathing, CTAB Cardiovascular: RRR, distal pulses 2+ Breast: Breast symmetrical, no tenderness, no palpable nodules or masses, no skin or nipple retraction present, no nipple discharge.  No axillary or supraclavicular  lymphadenopathy. Abdomen: NABS, soft, non-tender, non-distended.  Umbilicus without lesions.  No hepatomegaly, splenomegaly or masses palpable. No evidence of hernia  Genitourinary:  External: Normal external female genitalia.  Normal urethral meatus, normal Bartholin's and Skene's glands.    Vagina: Normal vaginal mucosa, no evidence of prolapse.    Cervix: Grossly normal in appearance, no bleeding  Uterus: Non-enlarged, mobile, normal contour.  No CMT  Adnexa: ovaries non-enlarged, no adnexal masses  Rectal: deferred  Lymphatic: no evidence of inguinal lymphadenopathy Extremities: no edema, erythema, or tenderness Neurologic: Grossly intact Psychiatric: mood appropriate, affect full   Assessment: 58 y.o. G1P1001 routine annual exam  Plan: Problem List Items Addressed This Visit   None Visit Diagnoses       Well woman exam with routine gynecological exam    -  Primary   Relevant Orders   Cytology - PAP  Screen for colon cancer       Relevant Orders   Ambulatory referral to Gastroenterology     Screening for cervical cancer       Relevant Orders   Cytology - PAP     Breast screening       Relevant Orders   MM 3D SCREENING MAMMOGRAM BILATERAL BREAST       1) Mammogram - recommend yearly screening mammogram.  Mammogram Was ordered today  2) STI screening  was offered and declined  3) ASCCP guidelines and rationale discussed.  Patient opts for every 5 years screening interval. Requested PAP today at 4 year interval.  4) Contraception - the patient is currently using   ablation .  She is  likely postmenopausal  5) Colonoscopy -- Screening recommended starting at age 30 for average risk individuals, age 62 for individuals deemed at increased risk (including African Americans) and recommended to continue until age 26.  For patient age 57-85 individualized approach is recommended.  Gold standard screening is via colonoscopy, Cologuard screening is an acceptable alternative  for patient unwilling or unable to undergo colonoscopy.  Colorectal cancer screening for average?risk adults: 2018 guideline update from the American Cancer SocietyCA: A Cancer Journal for Clinicians: Apr 13, 2017   6) Routine healthcare maintenance including cholesterol, diabetes screening discussed managed by PCP  7) Return in about 1 year (around 11/23/2024) for annual established gyn.   Slater Rains, CNM [facility] 11/24/2023 4:53 PM

## 2023-11-24 NOTE — Patient Instructions (Addendum)
    Address & Hours  [facility] [address], [address] [facility] [place],  [place]  [address]  Get Driving Directions Main: [phone]  Sunday:Closed Monday:7:20 AM - 5:00 PM Tuesday:7:20 AM - 5:00 PM Wednesday:7:20 AM - 5:00 PM Thursday:7:20 AM - 5:00 PM Friday:7:20 AM - 4:30 PM Saturday:Closed

## 2023-11-29 LAB — CYTOLOGY - PAP
Comment: NEGATIVE
Diagnosis: NEGATIVE
High risk HPV: NEGATIVE

## 2023-12-07 ENCOUNTER — Telehealth: Payer: Self-pay

## 2023-12-07 NOTE — Telephone Encounter (Signed)
Called patient and patient is schedule for colonoscopy on 01/06/2024 with Dr. Allegra Lai at [facility]. Dr. Allegra Lai is going to be in [place] that day. Patient Is okay with going to [place]. Sent out new instructions and Verlon Au will dropped patient in the depo and called kim and she will get patient moved

## 2023-12-08 ENCOUNTER — Encounter: Payer: Self-pay | Admitting: Advanced Practice Midwife

## 2023-12-09 ENCOUNTER — Ambulatory Visit: Payer: BC Managed Care – PPO | Admitting: Nurse Practitioner

## 2023-12-09 ENCOUNTER — Encounter: Payer: Self-pay | Admitting: Nurse Practitioner

## 2023-12-09 ENCOUNTER — Ambulatory Visit
Admission: RE | Admit: 2023-12-09 | Discharge: 2023-12-09 | Disposition: A | Discharge status: Home / Self Care | Payer: BC Managed Care – PPO | Source: Ambulatory Visit | Attending: Advanced Practice Midwife | Admitting: Advanced Practice Midwife

## 2023-12-09 VITALS — BP 122/70 | HR 71 | Temp 98.0°F | Ht 68.0 in | Wt 159.4 lb

## 2023-12-09 DIAGNOSIS — Z13820 Encounter for screening for osteoporosis: Secondary | ICD-10-CM

## 2023-12-09 DIAGNOSIS — Z1239 Encounter for other screening for malignant neoplasm of breast: Secondary | ICD-10-CM

## 2023-12-09 DIAGNOSIS — Z1231 Encounter for screening mammogram for malignant neoplasm of breast: Secondary | ICD-10-CM | POA: Diagnosis present

## 2023-12-09 DIAGNOSIS — J302 Other seasonal allergic rhinitis: Secondary | ICD-10-CM

## 2023-12-09 DIAGNOSIS — E559 Vitamin D deficiency, unspecified: Secondary | ICD-10-CM

## 2023-12-09 DIAGNOSIS — I83819 Varicose veins of unspecified lower extremities with pain: Secondary | ICD-10-CM

## 2023-12-09 DIAGNOSIS — M5442 Lumbago with sciatica, left side: Secondary | ICD-10-CM

## 2023-12-09 DIAGNOSIS — Z803 Family history of malignant neoplasm of breast: Secondary | ICD-10-CM

## 2023-12-09 DIAGNOSIS — E785 Hyperlipidemia, unspecified: Secondary | ICD-10-CM

## 2023-12-09 DIAGNOSIS — G8929 Other chronic pain: Secondary | ICD-10-CM | POA: Diagnosis not present

## 2023-12-09 LAB — LIPID PANEL
Cholesterol: 228 mg/dL — ABNORMAL HIGH (ref 0–200)
HDL: 82.3 mg/dL (ref >39.00)
LDL Cholesterol: 136 mg/dL — ABNORMAL HIGH (ref 0–99)
NonHDL: 145.82
Total CHOL/HDL Ratio: 3
Triglycerides: 49 mg/dL (ref 0.0–149.0)
VLDL: 9.8 mg/dL (ref 0.0–40.0)

## 2023-12-09 LAB — COMPREHENSIVE METABOLIC PANEL
ALT: 15 U/L (ref 0–35)
AST: 19 U/L (ref 0–37)
Albumin: 4.4 g/dL (ref 3.5–5.2)
Alkaline Phosphatase: 60 U/L (ref 39–117)
BUN: 17 mg/dL (ref 6–23)
CO2: 31 meq/L (ref 19–32)
Calcium: 9.6 mg/dL (ref 8.4–10.5)
Chloride: 103 meq/L (ref 96–112)
Creatinine, Ser: 0.71 mg/dL (ref 0.40–1.20)
GFR: 95.54 mL/min (ref >60.00)
Glucose, Bld: 77 mg/dL (ref 70–99)
Potassium: 4.4 meq/L (ref 3.5–5.1)
Sodium: 141 meq/L (ref 135–145)
Total Bilirubin: 0.4 mg/dL (ref 0.2–1.2)
Total Protein: 7.6 g/dL (ref 6.0–8.3)

## 2023-12-09 LAB — CBC WITH DIFFERENTIAL/PLATELET
Basophils Absolute: 0 10*3/uL (ref 0.0–0.1)
Basophils Relative: 0.3 % (ref 0.0–3.0)
Eosinophils Absolute: 0.6 10*3/uL (ref 0.0–0.7)
Eosinophils Relative: 6.9 % — ABNORMAL HIGH (ref 0.0–5.0)
HCT: 40.4 % (ref 36.0–46.0)
Hemoglobin: 13.3 g/dL (ref 12.0–15.0)
Lymphocytes Relative: 17.8 % (ref 12.0–46.0)
Lymphs Abs: 1.5 10*3/uL (ref 0.7–4.0)
MCHC: 32.8 g/dL (ref 30.0–36.0)
MCV: 92 fL (ref 78.0–100.0)
Monocytes Absolute: 0.6 10*3/uL (ref 0.1–1.0)
Monocytes Relative: 6.9 % (ref 3.0–12.0)
Neutro Abs: 5.6 10*3/uL (ref 1.4–7.7)
Neutrophils Relative %: 68.1 % (ref 43.0–77.0)
Platelets: 292 10*3/uL (ref 150.0–400.0)
RBC: 4.39 Mil/uL (ref 3.87–5.11)
RDW: 13.9 % (ref 11.5–15.5)
WBC: 8.3 10*3/uL (ref 4.0–10.5)

## 2023-12-09 LAB — TSH: TSH: 1.99 u[IU]/mL (ref 0.35–5.50)

## 2023-12-09 LAB — B12 AND FOLATE PANEL
Folate: 19.3 ng/mL (ref >5.9)
Vitamin B-12: 520 pg/mL (ref 211–911)

## 2023-12-09 LAB — VITAMIN D 25 HYDROXY (VIT D DEFICIENCY, FRACTURES): VITD: 51.83 ng/mL (ref 30.00–100.00)

## 2023-12-09 NOTE — Patient Instructions (Addendum)
We discussed your history of asthma, arthritis, allergies, and other health concerns. We reviewed your current medications and management plans, and we have ordered some lab tests and screenings to monitor your health.  YOUR PLAN:  -LOWER BACK PAIN WITH SCIATICA: This condition involves chronic pain in your lower back that sometimes radiates down your left leg, especially after sitting for long periods. We will continue with your current management plan, including intermittent use of Gabapentin and physical therapy, and monitor for any worsening symptoms.  -ALLERGIC RHINITIS: This is a condition where you experience chronic, year-round allergies with symptoms like sneezing and nasal drainage. We have renewed your prescriptions for Montelukast and Levocetirizine and encourage you to use these medications consistently during periods of increased symptoms.  -ASTHMA: You have a history of asthma from childhood, but you have not had any recent issues or needed an inhaler for the past 20 years. No current intervention is needed.  -ARTHRITIS: You have arthritis in your lower back, which is currently managed with physical therapy. We will continue with this management plan and monitor for any worsening symptoms.  -VITAMIN D DEFICIENCY: You have been diagnosed with low levels of Vitamin D and are taking daily supplements. We will order lab work to check your current Vitamin D levels.  -HYPERLIPIDEMIA: This means you have had higher cholesterol levels in the past. You have made dietary changes and exercise regularly. We will order lab work to check your current cholesterol levels.  -FAMILY HISTORY OF CANCER: You have multiple family members with various cancers, and you are concerned about your breast cancer risk. We will continue with regular mammogram screenings to monitor your health.  -GENERAL HEALTH MAINTENANCE: We will order a bone density test as you requested and check your Vitamin B12 levels. Please  schedule a follow-up visit in three months. We will discuss the results of your lab work via [employer] or a phone call as necessary.  INSTRUCTIONS:  Please schedule a follow-up visit in three months. We will discuss the results of your lab work via [employer] or a phone call as necessary.

## 2023-12-09 NOTE — Progress Notes (Signed)
New Patient Office Visit  Subjective   Patient ID: Vickie [name], female    DOB: 07-Feb-1966  Age: 58 y.o. MRN: [id number]  CC:  Chief Complaint  Patient presents with   Establish Care   Discussed the use of a AI scribe software for clinical note transcription with the patient, who gave verbal consent to proceed.  HPI Vickie [name] presents to establish care. Her previous PCP was Ms. Vickie Kennedy, NP at [facility] [place], has moved to [place] and is seeking to establish care closer to their new home in [place]. They have a history of asthma, arthritis, and allergies.   The asthma, severe during their childhood and teenage years, has not required the use of an inhaler for approximately twenty years. The arthritis affects their lower back, and they have been under the care of a spine specialist. They have received infusions for this condition and experience occasional leg pain, particularly after sitting for extended periods. The pain, described as sharp, originates in the lower back and radiates down the left leg. They are currently taking gabapentin for this condition, but not consistently.  The patient also suffers from severe allergies, resulting in constant ear and nasal drainage. They have undergone surgery on their nose due to frequent sneezing leading to nosebleeds. Despite the surgery, they continue to experience year-round allergies. They have been prescribed montelukast and levocetirizine, which they report as effective, but they prefer not to take these medications daily.  The patient has a family history of cancer, with multiple siblings diagnosed with various forms. They have undergone genetic testing and are under regular monitoring for breast cancer, with a mammogram scheduled for later the same day. They have expressed interest in preventive mastectomy but were advised against it due to their age.  They have a history of vitamin D deficiency and have been taking supplements  daily. They have expressed interest in having a bone density test  The patient is a former smoker, having quit in 2008. They have an allergy to shellfish but admit to occasionally consuming shrimp. They report no issues with blood pressure, bowel movements, or urination. They have been making dietary changes, primarily consuming chicken and fish, and exercise regularly under the guidance of their husband, a Systems analyst.   Health Maintenance  Topic Date Due   HIV Screening  Never done   Hepatitis C Screening  Never done   DTaP/Tdap/Td (1 - Tdap) Never done   Colonoscopy  Never done   Zoster Vaccines- Shingrix (2 of 2) 08/04/2022   COVID-19 Vaccine (1) 12/25/2023 (Originally 04/03/1971)   INFLUENZA VACCINE  02/13/2024 (Originally 06/16/2023)   MAMMOGRAM  12/08/2025   Cervical Cancer Screening (HPV/Pap Cotest)  11/23/2028   HPV VACCINES  Aged Out    There are no preventive care reminders to display for this patient.  Outpatient Encounter Medications as of 12/09/2023  Medication Sig   Cholecalciferol 1.25 MG (50000 UT) capsule Take by mouth.   gabapentin (NEURONTIN) 300 MG capsule Take 300 mg by mouth 3 (three) times daily.   levocetirizine (XYZAL) 5 MG tablet Take 5 mg by mouth every evening.   montelukast (SINGULAIR) 10 MG tablet Take by mouth.   [DISCONTINUED] Na Sulfate-K Sulfate-Mg Sulfate concentrate 17.5-3.13-1.6 GM/177ML SOLN See admin instructions. (Patient not taking: Reported on 12/09/2023)   No facility-administered encounter medications on file as of 12/09/2023.    Past Medical History:  Diagnosis Date   Allergy 2020   Arthritis    Asthma childhood   BRCA  negative 08/2021   MyRisk neg except TSC2 VUS   Family history of breast cancer    Genetic testing of female    pt had done in past, records request sent   Increased risk of breast cancer 08/2021   IBIS=22.6%/riskscore=20.9%    Past Surgical History:  Procedure Laterality Date   ABLATION Bilateral 2007    BREAST BIOPSY Left    benign   BREAST BIOPSY Left    benign   BREAST BIOPSY Right 07/13/2021   u/s bx axilla hrdro coil shape 3 neg   BREAST BIOPSY Right 03/24/2022   mri bx/ neg   CESAREAN SECTION     TUBAL LIGATION      Family History  Problem Relation Age of Onset   Breast cancer Mother    Cancer Mother    Varicose Veins Mother    Breast cancer Sister    Asthma Sister    Prostate cancer Brother 72   Asthma Brother    Cancer Brother    Prostate cancer Brother 68   Breast cancer Maternal Grandmother    Breast cancer Other    Cancer Sister    Cancer Sister    Cancer Brother     Social History   Socioeconomic History   Marital status: Married    Spouse name: Not on file   Number of children: 1   Years of education: Not on file   Highest education level: 11th grade  Occupational History   Not on file  Tobacco Use   Smoking status: Former    Current packs/day: 0.00    Types: Cigarettes    Quit date: 02/14/2007    Years since quitting: 16.8   Smokeless tobacco: Never   Tobacco comments:    Smoked for 4 years (12/09/23)  Vaping Use   Vaping status: Never Used  Substance and Sexual Activity   Alcohol use: Yes    Comment: occ   Drug use: Never   Sexual activity: Yes    Birth control/protection: None  Other Topics Concern   Not on file  Social History Narrative   Lives with Husband. Has 1 daughter lives in [place].    Social Drivers of Corporate investment banker Strain: Low Risk  (12/05/2023)   Overall Financial Resource Strain (CARDIA)    Difficulty of Paying Living Expenses: Not hard at all  Food Insecurity: Unknown (12/05/2023)   Hunger Vital Sign    Worried About Running Out of Food in the Last Year: Never true    Ran Out of Food in the Last Year: Not on file  Transportation Needs: No Transportation Needs (12/05/2023)   PRAPARE - Administrator, Civil Service (Medical): No    Lack of Transportation (Non-Medical): No  Physical Activity:  Sufficiently Active (12/05/2023)   Exercise Vital Sign    Days of Exercise per Week: 7 days    Minutes of Exercise per Session: 90 min  Stress: No Stress Concern Present (12/05/2023)   [employer] of Occupational Health - Occupational Stress Questionnaire    Feeling of Stress : Only a little  Social Connections: Moderately Integrated (12/05/2023)   Social Connection and Isolation Panel [NHANES]    Frequency of Communication with Friends and Family: Three times a week    Frequency of Social Gatherings with Friends and Family: More than three times a week    Attends Religious Services: 1 to 4 times per year    Active Member of Clubs or Organizations: No  Attends Banker Meetings: Not on file    Marital Status: Married  Intimate Partner Violence: Unknown (08/10/2023)   Received from [facility]   HITS    Physically Hurt: Not on file    Insult or Talk Down To: Not on file    Threaten Physical Harm: Not on file    Scream or Curse: Not on file    ROS Negative unless indicated in HPI.      Objective    BP 122/70   Pulse 71   Temp 98 F (36.7 C)   Ht 5\' 8"  (1.727 m)   Wt 159 lb 6.4 oz (72.3 kg)   SpO2 99%   BMI 24.24 kg/m   Physical Exam      Assessment & Plan:  Chronic midline low back pain with left-sided sciatica Assessment & Plan: Chronic lower back pain with intermittent left-sided sciatica. Pain increases with prolonged sitting. Currently managed with intermittent Gabapentin use and physical therapy. -Continue current management and monitor for worsening symptoms.   Seasonal allergies Assessment & Plan: Chronic, year-round allergies with frequent sneezing and nasal drainage. Previous nasal surgery for frequent nosebleeds. Currently managed with intermittent use of Montelukast and Levocetirizine. -Encourage consistent use of medications during periods of increased symptoms.    Hyperlipidemia, unspecified hyperlipidemia type Assessment &  Plan: Previous cholesterol levels on the higher side, patient has since made dietary changes and exercises regularly. -Order lab work to check current cholesterol levels.  Orders: -     CBC with Differential/Platelet -     Comprehensive metabolic panel -     Lipid panel -     TSH  Vitamin D deficiency Assessment & Plan: Previous diagnosis of Vitamin D deficiency, currently taking daily Vitamin D supplements. -Order lab work to check current Vitamin D levels.   Orders: -     VITAMIN D 25 Hydroxy (Vit-D Deficiency, Fractures) -     B12 and Folate Panel  Screening for osteoporosis -     DG Bone Density  Family history of breast cancer Assessment & Plan: Multiple family members with various cancers, patient is concerned about breast cancer risk. -Continue regular mammogram screenings.     Return in about 3 months (around 03/08/2024) for physical.   Kara Dies, NP

## 2023-12-16 DIAGNOSIS — Z803 Family history of malignant neoplasm of breast: Secondary | ICD-10-CM | POA: Insufficient documentation

## 2023-12-16 DIAGNOSIS — M545 Low back pain, unspecified: Secondary | ICD-10-CM | POA: Insufficient documentation

## 2023-12-16 DIAGNOSIS — G8929 Other chronic pain: Secondary | ICD-10-CM | POA: Insufficient documentation

## 2023-12-16 NOTE — Assessment & Plan Note (Signed)
Previous diagnosis of Vitamin D deficiency, currently taking daily Vitamin D supplements. -Order lab work to check current Vitamin D levels.

## 2023-12-16 NOTE — Assessment & Plan Note (Signed)
Chronic, year-round allergies with frequent sneezing and nasal drainage. Previous nasal surgery for frequent nosebleeds. Currently managed with intermittent use of Montelukast and Levocetirizine. -Encourage consistent use of medications during periods of increased symptoms.

## 2023-12-16 NOTE — Assessment & Plan Note (Signed)
Chronic lower back pain with intermittent left-sided sciatica. Pain increases with prolonged sitting. Currently managed with intermittent Gabapentin use and physical therapy. -Continue current management and monitor for worsening symptoms.

## 2023-12-16 NOTE — Assessment & Plan Note (Signed)
Multiple family members with various cancers, patient is concerned about breast cancer risk. -Continue regular mammogram screenings.

## 2023-12-16 NOTE — Assessment & Plan Note (Signed)
Previous cholesterol levels on the higher side, patient has since made dietary changes and exercises regularly. -Order lab work to check current cholesterol levels.

## 2023-12-18 ENCOUNTER — Encounter: Payer: Self-pay | Admitting: Advanced Practice Midwife

## 2023-12-19 ENCOUNTER — Telehealth: Payer: Self-pay

## 2023-12-19 NOTE — Telephone Encounter (Signed)
Explained to patient that her procedure was originally scheduled with Dr. Servando Snare on 01/06/24 however Dr. Servando Snare no longer does procedures at [facility] on Fridays, therefore the date was change from 02/21 to 02/20 at [facility] to accommodate her having Dr. Servando Snare.  She stated that she really needed a Friday in [place] due to her transportation.  Informed her that I could change the physician and she can still have colonoscopy as orginally scheduled at [facility] on 01/06/24 if she was willing to change physicians.  She said yes to changing physician.  Trish in Endo notified to drop her to the depot. Kim at [facility] has been asked to change physician to Dr. Allegra Lai instead of Dr. Servando Snare.  Thanks,  [place], [place]

## 2023-12-19 NOTE — Telephone Encounter (Signed)
The patient called in to get clarification for her procedure date because in the system it says 01/05/24 but her letter says 01/06/24. The patient would keep the 01/06/24 for her procedure because she has a driver. The patient would like confirmation on this procedure, and you can call the patient or send an email: [email or web address].

## 2023-12-20 ENCOUNTER — Encounter: Payer: Self-pay | Admitting: Gastroenterology

## 2024-01-04 ENCOUNTER — Other Ambulatory Visit: Payer: Self-pay | Admitting: General Surgery

## 2024-01-06 ENCOUNTER — Encounter
Admission: RE | Disposition: A | Discharge status: Home / Self Care | Payer: Self-pay | Source: Home / Self Care | Attending: Gastroenterology

## 2024-01-06 ENCOUNTER — Encounter: Payer: Self-pay | Admitting: Gastroenterology

## 2024-01-06 ENCOUNTER — Ambulatory Visit: Payer: BC Managed Care – PPO | Admitting: Anesthesiology

## 2024-01-06 ENCOUNTER — Other Ambulatory Visit: Payer: Self-pay

## 2024-01-06 ENCOUNTER — Ambulatory Visit
Admission: RE | Admit: 2024-01-06 | Discharge: 2024-01-06 | Disposition: A | Discharge status: Home / Self Care | Payer: BC Managed Care – PPO | Attending: Gastroenterology | Admitting: Gastroenterology

## 2024-01-06 DIAGNOSIS — Z87891 Personal history of nicotine dependence: Secondary | ICD-10-CM | POA: Insufficient documentation

## 2024-01-06 DIAGNOSIS — M1990 Unspecified osteoarthritis, unspecified site: Secondary | ICD-10-CM | POA: Insufficient documentation

## 2024-01-06 DIAGNOSIS — Z1211 Encounter for screening for malignant neoplasm of colon: Secondary | ICD-10-CM | POA: Diagnosis present

## 2024-01-06 DIAGNOSIS — J45909 Unspecified asthma, uncomplicated: Secondary | ICD-10-CM | POA: Insufficient documentation

## 2024-01-06 HISTORY — PX: COLONOSCOPY WITH PROPOFOL: SHX5780

## 2024-01-06 SURGERY — COLONOSCOPY WITH PROPOFOL
Anesthesia: General

## 2024-01-06 MED ORDER — SODIUM CHLORIDE 0.9 % IV SOLN: INTRAVENOUS | Status: DC

## 2024-01-06 MED ORDER — PROPOFOL 10 MG/ML IV BOLUS
INTRAVENOUS | Status: DC | PRN
  Administered 2024-01-06: 50 mg via INTRAVENOUS
  Administered 2024-01-06: 60 mg via INTRAVENOUS
  Administered 2024-01-06 (×2): 90 mg via INTRAVENOUS
  Administered 2024-01-06: 50 mg via INTRAVENOUS

## 2024-01-06 MED ORDER — LIDOCAINE HCL (CARDIAC) PF 100 MG/5ML IV SOSY
PREFILLED_SYRINGE | INTRAVENOUS | Status: DC | PRN
  Administered 2024-01-06: 50 mg via INTRAVENOUS

## 2024-01-06 MED ORDER — STERILE WATER FOR IRRIGATION IR SOLN: Status: DC | PRN

## 2024-01-06 MED ORDER — LIDOCAINE HCL (PF) 2 % IJ SOLN
INTRAMUSCULAR | Status: AC
  Filled 2024-01-06: qty 5

## 2024-01-06 MED ORDER — PROPOFOL 10 MG/ML IV BOLUS
INTRAVENOUS | Status: AC
Start: 2024-01-06 — End: ?
  Filled 2024-01-06: qty 20

## 2024-01-06 MED ORDER — PROPOFOL 10 MG/ML IV BOLUS
INTRAVENOUS | Status: AC
  Filled 2024-01-06: qty 20

## 2024-01-06 MED ORDER — STERILE WATER FOR IRRIGATION IR SOLN
Status: DC | PRN
  Administered 2024-01-06: 1000 mL

## 2024-01-06 SURGICAL SUPPLY — 4 items
GOWN CVR UNV OPN BCK APRN NK (MISCELLANEOUS) ×2 IMPLANT
KIT PRC NS LF DISP ENDO (KITS) ×1 IMPLANT
MANIFOLD NEPTUNE II (INSTRUMENTS) ×1 IMPLANT
WATER STERILE IRR 250ML POUR (IV SOLUTION) ×1 IMPLANT

## 2024-01-06 NOTE — Anesthesia Preprocedure Evaluation (Signed)
Anesthesia Evaluation  Patient identified by MRN, date of birth, ID band Patient awake    Reviewed: Allergy & Precautions, H&P , NPO status , Patient's Chart, lab work & pertinent test results  Airway Mallampati: II  TM Distance: >3 FB Neck ROM: Full    Dental no notable dental hx.    Pulmonary neg pulmonary ROS, asthma , former smoker   Pulmonary exam normal breath sounds clear to auscultation       Cardiovascular negative cardio ROS Normal cardiovascular exam Rhythm:Regular Rate:Normal     Neuro/Psych  Neuromuscular disease negative neurological ROS  negative psych ROS   GI/Hepatic negative GI ROS, Neg liver ROS,,,  Endo/Other  negative endocrine ROS    Renal/GU negative Renal ROS  negative genitourinary   Musculoskeletal negative musculoskeletal ROS (+) Arthritis ,    Abdominal   Peds negative pediatric ROS (+)  Hematology negative hematology ROS (+)   Anesthesia Other Findings Genetic testing of female  Family history of breast cancer BRCA negative  Increased risk of breast cancer Asthma  Arthritis Allergy     Reproductive/Obstetrics negative OB ROS                             Anesthesia Physical Anesthesia Plan  ASA: 2  Anesthesia Plan: General   Post-op Pain Management:    Induction: Intravenous  PONV Risk Score and Plan:   Airway Management Planned: Natural Airway and Nasal Cannula  Additional Equipment:   Intra-op Plan:   Post-operative Plan:   Informed Consent: I have reviewed the patients History and Physical, chart, labs and discussed the procedure including the risks, benefits and alternatives for the proposed anesthesia with the patient or authorized representative who has indicated his/her understanding and acceptance.     Dental Advisory Given  Plan Discussed with: Anesthesiologist, CRNA and Surgeon  Anesthesia Plan Comments: (Patient consented  for risks of anesthesia including but not limited to:  - adverse reactions to medications - risk of airway placement if required - damage to eyes, teeth, lips or other oral mucosa - nerve damage due to positioning  - sore throat or hoarseness - Damage to heart, brain, nerves, lungs, other parts of body or loss of life  Patient voiced understanding and assent.)       Anesthesia Quick Evaluation

## 2024-01-06 NOTE — H&P (Signed)
Arlyss Repress, MD [address]  [address]  [place], [place] [address]  Main: [phone]  Fax: [phone] Pager: [phone]  Primary Care Physician:  Kara Dies, NP Primary Gastroenterologist:  Dr. Arlyss Repress  Pre-Procedure History & Physical: HPI:  Vickie [name] is a 58 y.o. female is here for an colonoscopy.   Past Medical History:  Diagnosis Date   Allergy 2020   Arthritis    Asthma childhood   BRCA negative 08/2021   MyRisk neg except TSC2 VUS   Family history of breast cancer    Genetic testing of female    pt had done in past, records request sent   Increased risk of breast cancer 08/2021   IBIS=22.6%/riskscore=20.9%    Past Surgical History:  Procedure Laterality Date   ABLATION Bilateral 2007   BREAST BIOPSY Left    benign   BREAST BIOPSY Left    benign   BREAST BIOPSY Right 07/13/2021   u/s bx axilla hrdro coil shape 3 neg   BREAST BIOPSY Right 03/24/2022   mri bx/ neg   CESAREAN SECTION     TUBAL LIGATION      Prior to Admission medications   Medication Sig Start Date End Date Taking? Authorizing Provider  Cholecalciferol 1.25 MG (50000 UT) capsule Take by mouth. 07/06/22  Yes [provider]  gabapentin (NEURONTIN) 300 MG capsule Take 300 mg by mouth 3 (three) times daily.   Yes [provider]  levocetirizine (XYZAL) 5 MG tablet Take 5 mg by mouth every evening.   Yes [provider]  montelukast (SINGULAIR) 10 MG tablet Take by mouth. 09/10/22  Yes [provider]    Allergies as of 11/24/2023 - Review Complete 11/24/2023  Allergen Reaction Noted   Shellfish allergy Hives 06/09/2022    Family History  Problem Relation Age of Onset   Breast cancer Mother    Cancer Mother    Varicose Veins Mother    Breast cancer Sister    Asthma Sister    Prostate cancer Brother 70   Asthma Brother    Cancer Brother    Prostate cancer Brother 86   Breast cancer Maternal Grandmother    Breast cancer  Other    Cancer Sister    Cancer Sister    Cancer Brother     Social History   Socioeconomic History   Marital status: Married    Spouse name: Not on file   Number of children: 1   Years of education: Not on file   Highest education level: 11th grade  Occupational History   Not on file  Tobacco Use   Smoking status: Former    Current packs/day: 0.00    Types: Cigarettes    Quit date: 02/14/2007    Years since quitting: 16.9   Smokeless tobacco: Never   Tobacco comments:    Smoked for 4 years (12/09/23)  Vaping Use   Vaping status: Never Used  Substance and Sexual Activity   Alcohol use: Yes    Comment: occ   Drug use: Never   Sexual activity: Yes    Birth control/protection: None  Other Topics Concern   Not on file  Social History Narrative   Lives with Husband. Has 1 daughter lives in [place].    Social Drivers of Corporate investment banker Strain: Low Risk  (12/05/2023)   Overall Financial Resource Strain (CARDIA)    Difficulty of Paying Living Expenses: Not hard at all  Food Insecurity: Unknown (  12/05/2023)   Hunger Vital Sign    Worried About Running Out of Food in the Last Year: Never true    Ran Out of Food in the Last Year: Not on file  Transportation Needs: No Transportation Needs (12/05/2023)   PRAPARE - Administrator, Civil Service (Medical): No    Lack of Transportation (Non-Medical): No  Physical Activity: Sufficiently Active (12/05/2023)   Exercise Vital Sign    Days of Exercise per Week: 7 days    Minutes of Exercise per Session: 90 min  Stress: No Stress Concern Present (12/05/2023)   [employer] of Occupational Health - Occupational Stress Questionnaire    Feeling of Stress : Only a little  Social Connections: Moderately Integrated (12/05/2023)   Social Connection and Isolation Panel [NHANES]    Frequency of Communication with Friends and Family: Three times a week    Frequency of Social Gatherings with Friends and Family: More  than three times a week    Attends Religious Services: 1 to 4 times per year    Active Member of [employer] or Organizations: No    Attends Engineer, structural: Not on file    Marital Status: Married  Intimate Partner Violence: Unknown (08/10/2023)   Received from [facility]   HITS    Physically Hurt: Not on file    Insult or Talk Down To: Not on file    Threaten Physical Harm: Not on file    Scream or Curse: Not on file    Review of Systems: See HPI, otherwise negative ROS  Physical Exam: BP 133/65   Pulse 70   Temp 98.6 F (37 C) (Temporal)   Resp 10   Ht 5\' 8"  (1.727 m)   Wt 66.2 kg   SpO2 100%   BMI 22.20 kg/m  General:   Alert,  pleasant and cooperative in NAD Head:  Normocephalic and atraumatic. Neck:  Supple; no masses or thyromegaly. Lungs:  Clear throughout to auscultation.    Heart:  Regular rate and rhythm. Abdomen:  Soft, nontender and nondistended. Normal bowel sounds, without guarding, and without rebound.   Neurologic:  Alert and  oriented x4;  grossly normal neurologically.  Impression/Plan: Vickie [name] is here for an colonoscopy to be performed for colon cancer screening  Risks, benefits, limitations, and alternatives regarding  colonoscopy have been reviewed with the patient.  Questions have been answered.  All parties agreeable.   Lannette Donath, MD  01/06/2024, 9:23 AM

## 2024-01-06 NOTE — Transfer of Care (Signed)
Immediate Anesthesia Transfer of Care Note  Patient: Vickie [name]  Procedure(s) Performed: COLONOSCOPY WITH PROPOFOL  Patient Location: PACU  Anesthesia Type: General  Level of Consciousness: awake, alert  and patient cooperative  Airway and Oxygen Therapy: Patient Spontanous Breathing and Patient connected to supplemental oxygen  Post-op Assessment: Post-op Vital signs reviewed, Patient's Cardiovascular Status Stable, Respiratory Function Stable, Patent Airway and No signs of Nausea or vomiting  Post-op Vital Signs: Reviewed and stable  Complications: No notable events documented.

## 2024-01-06 NOTE — Op Note (Signed)
[facility] Patient Name: Vickie [name] Procedure Date: 01/06/2024 10:08 AM MRN: [id number] Account #: [id number] Date of Birth: 1965-12-06 Admit Type: Outpatient Age: 58 Room: [facility] OR ROOM 01 Gender: Female Note Status: Finalized Instrument Name: [id number] Procedure:             Colonoscopy Indications:           Screening for colorectal malignant neoplasm Providers:             Toney Reil MD, MD Referring MD:          Kara Dies (Referring MD) Medicines:             General Anesthesia Complications:         No immediate complications. Estimated blood loss: None. Procedure:             Pre-Anesthesia Assessment:                        - Prior to the procedure, a History and Physical was                         performed, and patient medications and allergies were                         reviewed. The patient is competent. The risks and                         benefits of the procedure and the sedation options and                         risks were discussed with the patient. All questions                         were answered and informed consent was obtained.                         Patient identification and proposed procedure were                         verified by the physician, the nurse, the                         anesthesiologist, the anesthetist and the technician                         in the pre-procedure area in the procedure room in the                         endoscopy suite. Mental Status Examination: alert and                         oriented. Airway Examination: normal oropharyngeal                         airway and neck mobility. Respiratory Examination:                         clear to auscultation. CV Examination: normal.  Prophylactic Antibiotics: The patient does not require                         prophylactic antibiotics. Prior Anticoagulants: The                         patient has  taken no anticoagulant or antiplatelet                         agents. ASA Grade Assessment: II - A patient with mild                         systemic disease. After reviewing the risks and                         benefits, the patient was deemed in satisfactory                         condition to undergo the procedure. The anesthesia                         plan was to use general anesthesia. Immediately prior                         to administration of medications, the patient was                         re-assessed for adequacy to receive sedatives. The                         heart rate, respiratory rate, oxygen saturations,                         blood pressure, adequacy of pulmonary ventilation, and                         response to care were monitored throughout the                         procedure. The physical status of the patient was                         re-assessed after the procedure.                        After obtaining informed consent, the colonoscope was                         passed under direct vision. Throughout the procedure,                         the patient's blood pressure, pulse, and oxygen                         saturations were monitored continuously. The was                         introduced through the anus and advanced to the the  cecum, identified by appendiceal orifice and ileocecal                         valve. The colonoscopy was performed without                         difficulty. The patient tolerated the procedure well.                         The quality of the bowel preparation was evaluated                         using the BBPS [facility] Bowel Preparation Scale) with                         scores of: Right Colon = 3, Transverse Colon = 3 and                         Left Colon = 3 (entire mucosa seen well with no                         residual staining, small fragments of stool or opaque                          liquid). The total BBPS score equals 9. The ileocecal                         valve, appendiceal orifice, and rectum were                         photographed. Findings:      The perianal and digital rectal examinations were normal. Pertinent       negatives include normal sphincter tone and no palpable rectal lesions.      The entire examined colon appeared normal.      The retroflexed view of the distal rectum and anal verge was normal and       showed no anal or rectal abnormalities. Impression:            - The entire examined colon is normal.                        - The distal rectum and anal verge are normal on                         retroflexion view.                        - No specimens collected. Recommendation:        - Discharge patient to home (with escort).                        - Resume previous diet today.                        - Continue present medications.                        - Repeat colonoscopy in 10 years for screening  purposes. Procedure Code(s):     --- Professional ---                        Z6109, Colorectal cancer screening; colonoscopy on                         individual not meeting criteria for high risk Diagnosis Code(s):     --- Professional ---                        Z12.11, Encounter for screening for malignant neoplasm                         of colon CPT copyright 2022 American Medical Association. All rights reserved. The codes documented in this report are preliminary and upon coder review may  be revised to meet current compliance requirements. Dr. Libby Maw Toney Reil MD, MD 01/06/2024 10:29:38 AM This report has been signed electronically. Number of Addenda: 0 Note Initiated On: 01/06/2024 10:08 AM Scope Withdrawal Time: 0 hours 7 minutes 8 seconds  Total Procedure Duration: 0 hours 10 minutes 58 seconds  Estimated Blood Loss:  Estimated blood loss: none.      [facility]

## 2024-01-06 NOTE — Anesthesia Postprocedure Evaluation (Signed)
Anesthesia Post Note  Patient: Vickie [name]  Procedure(s) Performed: COLONOSCOPY WITH PROPOFOL  Patient location during evaluation: PACU Anesthesia Type: General Level of consciousness: awake and alert Pain management: pain level controlled Vital Signs Assessment: post-procedure vital signs reviewed and stable Respiratory status: spontaneous breathing, nonlabored ventilation, respiratory function stable and patient connected to nasal cannula oxygen Cardiovascular status: blood pressure returned to baseline and stable Postop Assessment: no apparent nausea or vomiting Anesthetic complications: no   No notable events documented.   Last Vitals:  Vitals:   01/06/24 1036 01/06/24 1039  BP: (!) 100/57   Pulse: 76 72  Resp: 12 13  Temp:    SpO2: 98% 100%    Last Pain:  Vitals:   01/06/24 1036  TempSrc:   PainSc: 0-No pain                 Arrow Tomko C Tamim Skog

## 2024-01-15 ENCOUNTER — Ambulatory Visit
Admission: RE | Admit: 2024-01-15 | Discharge: 2024-01-15 | Disposition: A | Discharge status: Home / Self Care | Payer: BC Managed Care – PPO | Source: Ambulatory Visit | Attending: General Surgery | Admitting: General Surgery

## 2024-01-15 DIAGNOSIS — Z803 Family history of malignant neoplasm of breast: Secondary | ICD-10-CM

## 2024-01-15 DIAGNOSIS — Z1239 Encounter for other screening for malignant neoplasm of breast: Secondary | ICD-10-CM

## 2024-01-15 MED ORDER — GADOPICLENOL 0.5 MMOL/ML IV SOLN
7.0000 mL | Freq: Once | INTRAVENOUS | Status: AC | PRN
  Administered 2024-01-15: 7 mL via INTRAVENOUS

## 2024-01-27 ENCOUNTER — Ambulatory Visit: Payer: Self-pay | Admitting: Nurse Practitioner

## 2024-01-27 NOTE — Telephone Encounter (Signed)
 Copied from CRM [id number]. Topic: Clinical - Red Word Triage >> Jan 27, 2024  3:56 PM Vickie [name] wrote: Red Word that prompted transfer to Nurse Triage: Pain level 9/10 from the buttock down to the leg, and while sitting & standing. At work she is fine but as soon as she gets home and tries to relax it becomes painful through out the entire night.   Chief Complaint: low back pain Symptoms: shooting down leg Frequency: intermittent Pertinent Negatives: Patient denies new injury Disposition: [] ED /[] Urgent Care (no appt availability in office) / [x] Appointment(In office/virtual)/ []  [facility]/ [] Home Care/ [] Refused Recommended Disposition /[] [facility]/ []  Follow-up with PCP Additional Notes: low back pain worsening over the past month, pt sts that the pain is worse especially after work. Pt states that she does not like taking her Gabapentin because it makes her drowsy, she's only been taking Tylenol. Appt scheduled for 3/20. Self care encouraged. RN also encouraged patient to take her gabapentin to give her some relief, but perhaps only at night when she doesn't have to drive. Pt sts that she will consider it. RN also advised patient that if her pain get worse then to go to an UC or ED for sooner eval. Pt sts that she can manage (for now) as she has done the past month.    Reason for Disposition  Back pain is [name] chronic symptom (recurrent or ongoing AND present > 4 weeks)  Answer Assessment - Initial Assessment Questions 1. ONSET: "When did the pain begin?"      Been ggoing on [name] year and [name] half but getting worse thsi past month  2. LOCATION: "Where does it hurt?" (upper, mid or lower back)     Left lower back  3. SEVERITY: "How bad is the pain?"  (e.g., Scale 1-10; mild, moderate, or severe)   - MILD (1-3): Doesn't interfere with normal activities.    - MODERATE (4-7): Interferes with normal activities or awakens from sleep.    - SEVERE (8-10): Excruciating pain,  unable to do any normal activities.      9/10  4. PATTERN: "Is the pain constant?" (e.g., yes, no; constant, intermittent)      Comes and goes, worse when trying sit or relax  5. RADIATION: "Does the pain shoot into your legs or somewhere else?"     Radiating down left leg  6. CAUSE:  "What do you think is causing the back pain?"      History of chronic low back pain with sciatica  7. BACK OVERUSE:  "Any recent lifting of heavy objects, strenuous work or exercise?"     No  8. MEDICINES: "What have you taken so far for the pain?" (e.g., nothing, acetaminophen, NSAIDS)     Has only taken Tylenol  9. NEUROLOGIC SYMPTOMS: "Do you have any weakness, numbness, or problems with bowel/bladder control?"     No  10. OTHER SYMPTOMS: "Do you have any other symptoms?" (e.g., fever, abdomen pain, burning with urination, blood in urine)       No  11. PREGNANCY: "Is there any chance you are pregnant?" "When was your last menstrual period?"       No  Protocols used: Back Pain-[name]-AH

## 2024-02-02 ENCOUNTER — Ambulatory Visit (INDEPENDENT_AMBULATORY_CARE_PROVIDER_SITE_OTHER): Admitting: Nurse Practitioner

## 2024-02-02 ENCOUNTER — Encounter: Payer: Self-pay | Admitting: Nurse Practitioner

## 2024-02-02 VITALS — BP 114/72 | HR 73 | Temp 98.4°F | Ht 68.0 in | Wt 161.0 lb

## 2024-02-02 DIAGNOSIS — G8929 Other chronic pain: Secondary | ICD-10-CM | POA: Diagnosis not present

## 2024-02-02 DIAGNOSIS — M5442 Lumbago with sciatica, left side: Secondary | ICD-10-CM

## 2024-02-02 MED ORDER — PREDNISONE 10 MG PO TABS: ORAL_TABLET | ORAL | 0 refills | Status: DC

## 2024-02-02 MED ORDER — MELOXICAM 7.5 MG PO TABS: 7.5000 mg | ORAL_TABLET | Freq: Every day | ORAL | 0 refills | Status: AC

## 2024-02-02 NOTE — Patient Instructions (Addendum)
 LEFT-SIDED SCIATICA: Sciatica is pain that radiates along the path of the sciatic nerve, which runs from your lower back through your hips and buttocks and down each leg. For your sciatica, we recommend starting prednisone to reduce inflammation, beginning with 40 mg for two days, then tapering to 30 mg, 20 mg, and 10 mg. We also suggest physical therapy to help alleviate pressure on the sciatic nerve. For pain management, meloxicam can be taken once daily as needed, preferably at night. Additionally, we provided guidance on sleeping positions to help reduce spinal pressure.  Referral for Physical therapy sent to  [facility] [address], [place], [place] [address] Phone: [phone]

## 2024-02-02 NOTE — Progress Notes (Signed)
 Established Patient Office Visit  Subjective:  Patient ID: Vickie [name], female    DOB: 04-22-1966  Age: 58 y.o. MRN: [id number]  CC:  Chief Complaint  Patient presents with   Back Pain    Low back pain radiating down leg   Discussed the use of a AI scribe software for clinical note transcription with the patient, who gave verbal consent to proceed.   HPI Vickie [name] is a 58 year old female with scoliosis who presents with worsening lower back and leg pain.  She has been experiencing lower back that radiate down her left leg for about three years, which has become unmanageable over the past week. The pain is severe, especially after sitting for long periods or at the end of the day, and  becoming unbearable by nighttime. She takes gabapentin sometimes but not consistently for back pain.    X- ray lumbar spine 06/09/22 FINDINGS:  5 nonrib-bearing vertebrae are present with a rotatory levoscoliotic curvature on the frontal view.  Suboptimal lateral view shows normal alignment.  There is disc space narrowing at L3-4 with vertebral body spurring. L4-5 disc space also appears narrowed.  Degenerative changes are present to some degree throughout the facet joints. There are no visible pars defects.    Back Pain     Past Medical History:  Diagnosis Date   Allergy 2020   Arthritis    Asthma childhood   BRCA negative 08/2021   MyRisk neg except TSC2 VUS   Family history of breast cancer    Genetic testing of female    pt had done in past, records request sent   Increased risk of breast cancer 08/2021   IBIS=22.6%/riskscore=20.9%    Past Surgical History:  Procedure Laterality Date   ABLATION Bilateral 2007   BREAST BIOPSY Left    benign   BREAST BIOPSY Left    benign   BREAST BIOPSY Right 07/13/2021   u/s bx axilla hrdro coil shape 3 neg   BREAST BIOPSY Right 03/24/2022   mri bx/ neg   CESAREAN SECTION     COLONOSCOPY WITH PROPOFOL N/A 01/06/2024   Procedure:  COLONOSCOPY WITH PROPOFOL;  Surgeon: Toney Reil, MD;  Location: [facility];  Service: Endoscopy;  Laterality: N/A;   TUBAL LIGATION      Family History  Problem Relation Age of Onset   Breast cancer Mother    Cancer Mother    Varicose Veins Mother    Breast cancer Sister    Asthma Sister    Prostate cancer Brother 71   Asthma Brother    Cancer Brother    Prostate cancer Brother 35   Breast cancer Maternal Grandmother    Breast cancer Other    Cancer Sister    Cancer Sister    Cancer Brother     Social History   Socioeconomic History   Marital status: Married    Spouse name: Not on file   Number of children: 1   Years of education: Not on file   Highest education level: 11th grade  Occupational History   Not on file  Tobacco Use   Smoking status: Former    Current packs/day: 0.00    Types: Cigarettes    Quit date: 02/14/2007    Years since quitting: 16.9   Smokeless tobacco: Never   Tobacco comments:    Smoked for 4 years (12/09/23)  Vaping Use   Vaping status: Never Used  Substance and Sexual Activity  Alcohol use: Yes    Comment: occ   Drug use: Never   Sexual activity: Yes    Birth control/protection: None  Other Topics Concern   Not on file  Social History Narrative   Lives with Husband. Has 1 daughter lives in [place].    Social Drivers of Corporate investment banker Strain: Low Risk  (12/05/2023)   Overall Financial Resource Strain (CARDIA)    Difficulty of Paying Living Expenses: Not hard at all  Food Insecurity: Unknown (12/05/2023)   Hunger Vital Sign    Worried About Running Out of Food in the Last Year: Never true    Ran Out of Food in the Last Year: Not on file  Transportation Needs: No Transportation Needs (12/05/2023)   PRAPARE - Administrator, Civil Service (Medical): No    Lack of Transportation (Non-Medical): No  Physical Activity: Sufficiently Active (12/05/2023)   Exercise Vital Sign    Days of Exercise  per Week: 7 days    Minutes of Exercise per Session: 90 min  Stress: No Stress Concern Present (12/05/2023)   [employer] of Occupational Health - Occupational Stress Questionnaire    Feeling of Stress : Only a little  Social Connections: Moderately Integrated (12/05/2023)   Social Connection and Isolation Panel [NHANES]    Frequency of Communication with Friends and Family: Three times a week    Frequency of Social Gatherings with Friends and Family: More than three times a week    Attends Religious Services: 1 to 4 times per year    Active Member of [employer] or Organizations: No    Attends Engineer, structural: Not on file    Marital Status: Married  Intimate Partner Violence: Unknown (08/10/2023)   Received from [facility]   HITS    Physically Hurt: Not on file    Insult or Talk Down To: Not on file    Threaten Physical Harm: Not on file    Scream or Curse: Not on file     Outpatient Medications Prior to Visit  Medication Sig Dispense Refill   Cholecalciferol 1.25 MG (50000 UT) capsule Take by mouth.     gabapentin (NEURONTIN) 300 MG capsule Take 300 mg by mouth 3 (three) times daily.     levocetirizine (XYZAL) 5 MG tablet Take 5 mg by mouth every evening.     montelukast (SINGULAIR) 10 MG tablet Take by mouth.     Omega-3 Fatty Acids (FISH OIL) 300 MG CAPS Take by mouth.     No facility-administered medications prior to visit.    Allergies  Allergen Reactions   Shellfish Allergy Hives    ROS Review of Systems  Musculoskeletal:  Positive for back pain.   Negative unless indicated in HPI.    Objective:    Physical Exam Constitutional:      Appearance: Normal appearance.  Cardiovascular:     Rate and Rhythm: Normal rate and regular rhythm.     Pulses: Normal pulses.     Heart sounds: Normal heart sounds.  Abdominal:     Tenderness: There is no right CVA tenderness or left CVA tenderness.  Musculoskeletal:        General: No tenderness.      Cervical back: Normal range of motion.     Lumbar back: Positive left straight leg raise test. Negative right straight leg raise test.     Right lower leg: No edema.     Left lower leg: No edema.  Neurological:     General: No focal deficit present.     Mental Status: She is alert. Mental status is at baseline.  Psychiatric:        Mood and Affect: Mood normal.        Behavior: Behavior normal.        Thought Content: Thought content normal.        Judgment: Judgment normal.     BP 114/72   Pulse 73   Temp 98.4 F (36.9 C)   Ht 5\' 8"  (1.727 m)   Wt 161 lb (73 kg)   SpO2 99%   BMI 24.48 kg/m  Wt Readings from Last 3 Encounters:  02/02/24 161 lb (73 kg)  01/06/24 146 lb (66.2 kg)  12/09/23 159 lb 6.4 oz (72.3 kg)     Health Maintenance  Topic Date Due   HIV Screening  Never done   Hepatitis C Screening  Never done   DTaP/Tdap/Td (1 - Tdap) Never done   Zoster Vaccines- Shingrix (2 of 2) 08/04/2022   INFLUENZA VACCINE  02/13/2024 (Originally 06/16/2023)   COVID-19 Vaccine (1) 02/15/2024 (Originally 04/03/1971)   MAMMOGRAM  01/14/2026   Cervical Cancer Screening (HPV/Pap Cotest)  11/23/2028   Colonoscopy  01/05/2034   HPV VACCINES  Aged Out    There are no preventive care reminders to display for this patient.  Lab Results  Component Value Date   TSH 1.99 12/09/2023   Lab Results  Component Value Date   WBC 8.3 12/09/2023   HGB 13.3 12/09/2023   HCT 40.4 12/09/2023   MCV 92.0 12/09/2023   PLT 292.0 12/09/2023   Lab Results  Component Value Date   NA 141 12/09/2023   K 4.4 12/09/2023   CO2 31 12/09/2023   GLUCOSE 77 12/09/2023   BUN 17 12/09/2023   CREATININE 0.71 12/09/2023   BILITOT 0.4 12/09/2023   ALKPHOS 60 12/09/2023   AST 19 12/09/2023   ALT 15 12/09/2023   PROT 7.6 12/09/2023   ALBUMIN 4.4 12/09/2023   CALCIUM 9.6 12/09/2023   GFR 95.54 12/09/2023   Lab Results  Component Value Date   CHOL 228 (H) 12/09/2023   Lab Results  Component  Value Date   HDL 82.30 12/09/2023   Lab Results  Component Value Date   LDLCALC 136 (H) 12/09/2023   Lab Results  Component Value Date   TRIG 49.0 12/09/2023   Lab Results  Component Value Date   CHOLHDL 3 12/09/2023   Lab Results  Component Value Date   HGBA1C 5.2 10/21/2020      Assessment & Plan:  Chronic midline low back pain with left-sided sciatica Assessment & Plan: Chronic left-sided sciatica with recent exacerbation. H/o levoscoliosis and degenerative changes of facet joint. Positive straight leg raise. Exercises daily and stretches her back at the gym. -Will start on prednisone tapering and meloxicam. -Referral send for physical therapy.   Orders: -     Ambulatory referral to Physical Therapy  Other orders -     predniSONE; Take 4 tablets ( total 40 mg) by mouth for 2 days; take 3 tablets ( total 30 mg) by mouth for 2 days; take 2 tablets ( total 20 mg) by mouth for 1 day; take 1 tablet ( total 10 mg) by mouth for 1 day.  Dispense: 17 tablet; Refill: 0 -     Meloxicam; Take 1 tablet (7.5 mg total) by mouth daily.  Dispense: 30 tablet; Refill: 0    Follow-up: No  follow-ups on file.   Kara Dies, NP

## 2024-02-02 NOTE — Assessment & Plan Note (Signed)
 Chronic left-sided sciatica with recent exacerbation. H/o levoscoliosis and degenerative changes of facet joint. Positive straight leg raise. Exercises daily and stretches her back at the gym. -Will start on prednisone tapering and meloxicam. -Referral send for physical therapy.

## 2024-02-29 ENCOUNTER — Other Ambulatory Visit: Payer: Self-pay | Admitting: Nurse Practitioner

## 2024-03-08 ENCOUNTER — Ambulatory Visit (INDEPENDENT_AMBULATORY_CARE_PROVIDER_SITE_OTHER): Payer: BC Managed Care – PPO | Admitting: Nurse Practitioner

## 2024-03-08 ENCOUNTER — Encounter: Payer: Self-pay | Admitting: Nurse Practitioner

## 2024-03-08 VITALS — BP 116/74 | HR 71 | Temp 98.4°F | Ht 68.0 in | Wt 160.0 lb

## 2024-03-08 DIAGNOSIS — M5442 Lumbago with sciatica, left side: Secondary | ICD-10-CM

## 2024-03-08 DIAGNOSIS — Z0000 Encounter for general adult medical examination without abnormal findings: Secondary | ICD-10-CM

## 2024-03-08 DIAGNOSIS — Z23 Encounter for immunization: Secondary | ICD-10-CM | POA: Diagnosis not present

## 2024-03-08 DIAGNOSIS — Z13220 Encounter for screening for lipoid disorders: Secondary | ICD-10-CM

## 2024-03-08 DIAGNOSIS — G8929 Other chronic pain: Secondary | ICD-10-CM

## 2024-03-08 NOTE — Progress Notes (Signed)
 Established Patient Office Visit  Subjective:  Patient ID: Vickie [name], female    DOB: [birth year]  Age: 58 y.o. MRN: [id number]  CC:  Chief Complaint  Patient presents with   Annual Exam    HPI  Vickie [name] presents [id number] presents to the clinic for annual physical exam. Diet: Patient mainly eat chicken and fish. Patient consumes fruits and veggies. Patient eat fried food once evert 2 weeks. Patient drinks water  , body armour and coffee Exercise: Gym, treadmill Vaccine  Flu: declined Tetanus: today Shingles: today Colonoscopy: Feb 2025 Pap smear:Jan, 2025 Mammogram: March, 2025  Family history:  Colon cancer:no   Breast cancer: yes  Dentist: Due Ophthalmology: due Tobacco use: Former Alcohol use:  No Illicit drugs: No   HPI   Past Medical History:  Diagnosis Date   Allergy 2020   Arthritis    Asthma childhood   BRCA negative 08/2021   MyRisk neg except TSC2 VUS   Family history of breast cancer    Genetic testing of female    pt had done in past, records request sent   Increased risk of breast cancer 08/2021   IBIS=22.6%/riskscore=20.9%    Past Surgical History:  Procedure Laterality Date   ABLATION Bilateral 2007   BREAST BIOPSY Left    benign   BREAST BIOPSY Left    benign   BREAST BIOPSY Right 07/13/2021   u/s bx axilla hrdro coil shape 3 neg   BREAST BIOPSY Right 03/24/2022   mri bx/ neg   CESAREAN SECTION     COLONOSCOPY WITH PROPOFOL  N/A 01/06/2024   Procedure: COLONOSCOPY WITH PROPOFOL ;  Surgeon: Selena Daily, MD;  Location: [facility];  Service: Endoscopy;  Laterality: N/A;   TUBAL LIGATION      Family History  Problem Relation Age of Onset   Breast cancer Mother    Cancer Mother    Varicose Veins Mother    Breast cancer Sister    Asthma Sister    Prostate cancer Brother 32   Asthma Brother    Cancer Brother    Prostate cancer Brother 53   Breast cancer Maternal Grandmother    Breast cancer Other     Cancer Sister    Cancer Sister    Cancer Brother     Social History   Socioeconomic History   Marital status: Married    Spouse name: Not on file   Number of children: 1   Years of education: Not on file   Highest education level: 11th grade  Occupational History   Not on file  Tobacco Use   Smoking status: Former    Current packs/day: 0.00    Types: Cigarettes    Quit date: 02/14/2007    Years since quitting: 17.0   Smokeless tobacco: Never   Tobacco comments:    Smoked for 4 years (12/09/23)  Vaping Use   Vaping status: Never Used  Substance and Sexual Activity   Alcohol use: Yes    Comment: occ   Drug use: Never   Sexual activity: Yes    Birth control/protection: None  Other Topics Concern   Not on file  Social History Narrative   Lives with Husband. Has 1 daughter lives in [place] .    Social Drivers of Health   Financial Resource Strain: Low Risk  (12/05/2023)   Overall Financial Resource Strain (CARDIA)    Difficulty of Paying Living Expenses: Not hard at all  Food Insecurity: Unknown (12/05/2023)   Hunger Vital Sign  Worried About Programme researcher, broadcasting/film/video in the Last Year: Never true    [employer] of Food in the Last Year: Not on file  Transportation Needs: No Transportation Needs (12/05/2023)   PRAPARE - Administrator, Civil Service (Medical): No    Lack of Transportation (Non-Medical): No  Physical Activity: Sufficiently Active (12/05/2023)   Exercise Vital Sign    Days of Exercise per Week: 7 days    Minutes of Exercise per Session: 90 min  Stress: No Stress Concern Present (12/05/2023)   [employer] of Occupational Health - Occupational Stress Questionnaire    Feeling of Stress : Only a little  Social Connections: Moderately Integrated (12/05/2023)   Social Connection and Isolation Panel [NHANES]    Frequency of Communication with Friends and Family: Three times a week    Frequency of Social Gatherings with Friends and Family: More than three  times a week    Attends Religious Services: 1 to 4 times per year    Active Member of [employer] or Organizations: No    Attends Engineer, structural: Not on file    Marital Status: Married  Intimate Partner Violence: Unknown (08/10/2023)   Received from [facility]   HITS    Physically Hurt: Not on file    Insult or Talk Down To: Not on file    Threaten Physical Harm: Not on file    Scream or Curse: Not on file     Outpatient Medications Prior to Visit  Medication Sig Dispense Refill   Cholecalciferol 1.25 MG (50000 UT) capsule Take by mouth.     gabapentin (NEURONTIN) 300 MG capsule Take 300 mg by mouth 3 (three) times daily.     levocetirizine (XYZAL) 5 MG tablet Take 5 mg by mouth every evening.     meloxicam  (MOBIC ) 7.5 MG tablet Take 1 tablet (7.5 mg total) by mouth daily. 30 tablet 0   montelukast (SINGULAIR) 10 MG tablet Take by mouth.     Omega-3 Fatty Acids (FISH OIL) 300 MG CAPS Take by mouth.     predniSONE  (DELTASONE ) 10 MG tablet Take 4 tablets ( total 40 mg) by mouth for 2 days; take 3 tablets ( total 30 mg) by mouth for 2 days; take 2 tablets ( total 20 mg) by mouth for 1 day; take 1 tablet ( total 10 mg) by mouth for 1 day. 17 tablet 0   No facility-administered medications prior to visit.    Allergies  Allergen Reactions   Shellfish Allergy Hives    ROS Review of Systems Negative unless indicated in HPI.    Objective:    Physical Exam Constitutional:      Appearance: Normal appearance. She is normal weight.  HENT:     Head: Normocephalic.     Right Ear: Tympanic membrane normal.     Left Ear: Tympanic membrane normal.     Nose: Nose normal.     Mouth/Throat:     Mouth: Mucous membranes are moist.  Eyes:     Extraocular Movements: Extraocular movements intact.     Conjunctiva/sclera: Conjunctivae normal.     Pupils: Pupils are equal, round, and reactive to light.  Neck:     Thyroid: No thyroid mass or thyroid tenderness.  Cardiovascular:      Rate and Rhythm: Normal rate and regular rhythm.     Pulses: Normal pulses.     Heart sounds: Normal heart sounds. No murmur heard. Pulmonary:     Effort:  Pulmonary effort is normal.     Breath sounds: Normal breath sounds.  Chest:     Chest wall: No mass.  Breasts:    Right: Normal. No mass or nipple discharge.     Left: Normal. No mass or nipple discharge.  Abdominal:     General: Bowel sounds are normal. There is no distension.     Palpations: Abdomen is soft. There is no mass.     Tenderness: There is no abdominal tenderness. There is no rebound.     Hernia: No hernia is present.  Musculoskeletal:        General: No swelling.     Right lower leg: No edema.     Left lower leg: No edema.  Lymphadenopathy:     Upper Body:     Right upper body: No axillary adenopathy.     Left upper body: No axillary adenopathy.  Skin:    General: Skin is warm.     Capillary Refill: Capillary refill takes less than 2 seconds.     Findings: No bruising, erythema or rash.  Neurological:     General: No focal deficit present.     Mental Status: She is alert and oriented to person, place, and time. Mental status is at baseline.  Psychiatric:        Mood and Affect: Mood normal.        Behavior: Behavior normal.        Thought Content: Thought content normal.        Judgment: Judgment normal.     BP 116/74   Pulse 71   Temp 98.4 F (36.9 C)   Ht 5\' 8"  (1.727 m)   Wt 160 lb (72.6 kg)   SpO2 99%   BMI 24.33 kg/m  Wt Readings from Last 3 Encounters:  03/08/24 160 lb (72.6 kg)  02/02/24 161 lb (73 kg)  01/06/24 146 lb (66.2 kg)     Health Maintenance  Topic Date Due   COVID-19 Vaccine (1) 03/21/2024 (Originally 04/[birth year]/1973)   INFLUENZA VACCINE  06/15/2024   MAMMOGRAM  01/14/2026   Cervical Cancer Screening (HPV/Pap Cotest)  11/23/2028   Colonoscopy  01/05/2034   DTaP/Tdap/Td (2 - Td or Tdap) 03/08/2034   Hepatitis C Screening  Completed   HIV Screening  Completed   Zoster  Vaccines- Shingrix   Completed   HPV VACCINES  Aged Out   Meningococcal B Vaccine  Aged Out    There are no preventive care reminders to display for this patient.  Lab Results  Component Value Date   TSH 1.99 12/09/2023   Lab Results  Component Value Date   WBC 8.3 12/09/2023   HGB 13.3 12/09/2023   HCT 40.4 12/09/2023   MCV 92.0 12/09/2023   PLT 292.0 12/09/2023   Lab Results  Component Value Date   NA 141 12/09/2023   K 4.4 12/09/2023   CO2 31 12/09/2023   GLUCOSE 77 12/09/2023   BUN 17 12/09/2023   CREATININE 0.71 12/09/2023   BILITOT 0.4 12/09/2023   ALKPHOS 60 12/09/2023   AST 19 12/09/2023   ALT 15 12/09/2023   PROT 7.6 12/09/2023   ALBUMIN 4.4 12/09/2023   CALCIUM 9.6 12/09/2023   GFR 95.54 12/09/2023   Lab Results  Component Value Date   CHOL 220 (H) 03/08/2024   Lab Results  Component Value Date   HDL 82.60 03/08/2024   Lab Results  Component Value Date   LDLCALC 120 (H) 03/08/2024   Lab Results  Component Value Date   TRIG 87.0 03/08/2024   Lab Results  Component Value Date   CHOLHDL 3 03/08/2024   Lab Results  Component Value Date   HGBA1C 5.2 10/21/2020      Assessment & Plan:  Annual physical exam Assessment & Plan: -Encouraged patient to consume a balanced diet and regular exercise regimen. -Advised to see an eye doctor and dentist annually.  - Up-to-date on Pap, mammogram and colonoscopy. -Tetanus and shingles vaccine provided.   Orders: -     Lipid panel -     HIV Antibody (routine testing w rflx) -     Hepatitis C antibody  Need for Tdap vaccination -     Tdap vaccine greater than or equal to 7yo IM  Need for shingles vaccine -     Varicella-zoster vaccine IM  Chronic midline low back pain with left-sided sciatica Assessment & Plan: Patient symptoms of back pain has not been improved with PT. She was followed by pain management before at [facility]. Will refer to  pain management.  Orders: -     Ambulatory referral to  Pain Clinic    Follow-up: No follow-ups on file.   Oretha Weismann, NP

## 2024-03-09 LAB — LIPID PANEL
Cholesterol: 220 mg/dL — ABNORMAL HIGH (ref 0–200)
HDL: 82.6 mg/dL (ref >39.00)
LDL Cholesterol: 120 mg/dL — ABNORMAL HIGH (ref 0–99)
NonHDL: 137.73
Total CHOL/HDL Ratio: 3
Triglycerides: 87 mg/dL (ref 0.0–149.0)
VLDL: 17.4 mg/dL (ref 0.0–40.0)

## 2024-03-10 LAB — HEPATITIS C ANTIBODY: Hepatitis C Ab: NONREACTIVE

## 2024-03-10 LAB — HIV ANTIBODY (ROUTINE TESTING W REFLEX): HIV 1&2 Ab, 4th Generation: NONREACTIVE

## 2024-03-15 ENCOUNTER — Encounter: Payer: Self-pay | Admitting: Nurse Practitioner

## 2024-03-15 NOTE — Assessment & Plan Note (Signed)
-  Encouraged patient to consume a balanced diet and regular exercise regimen. -Advised to see an eye doctor and dentist annually.  - Up-to-date on Pap, mammogram and colonoscopy. -Tetanus and shingles vaccine provided.

## 2024-03-15 NOTE — Assessment & Plan Note (Signed)
 Patient symptoms of back pain has not been improved with PT. She was followed by pain management before at [facility]. Will refer to  pain management.

## 2024-03-28 ENCOUNTER — Telehealth: Payer: Self-pay

## 2024-03-28 DIAGNOSIS — J302 Other seasonal allergic rhinitis: Secondary | ICD-10-CM | POA: Insufficient documentation

## 2024-03-28 DIAGNOSIS — J343 Hypertrophy of nasal turbinates: Secondary | ICD-10-CM | POA: Insufficient documentation

## 2024-03-28 DIAGNOSIS — J338 Other polyp of sinus: Secondary | ICD-10-CM | POA: Insufficient documentation

## 2024-03-28 DIAGNOSIS — R040 Epistaxis: Secondary | ICD-10-CM | POA: Insufficient documentation

## 2024-03-28 DIAGNOSIS — Z79899 Other long term (current) drug therapy: Secondary | ICD-10-CM | POA: Insufficient documentation

## 2024-03-28 DIAGNOSIS — M899 Disorder of bone, unspecified: Secondary | ICD-10-CM | POA: Insufficient documentation

## 2024-03-28 DIAGNOSIS — G894 Chronic pain syndrome: Secondary | ICD-10-CM | POA: Insufficient documentation

## 2024-03-28 DIAGNOSIS — Z789 Other specified health status: Secondary | ICD-10-CM | POA: Insufficient documentation

## 2024-03-28 NOTE — Patient Instructions (Signed)

## 2024-03-28 NOTE — Telephone Encounter (Signed)
 Copied from CRM [id number]. Topic: General - Other >> Mar 28, 2024  9:24 AM Annelle Kiel wrote: Reason for CRM: patient is still having back pain she would like a call back regarding this

## 2024-03-28 NOTE — Telephone Encounter (Signed)
 Spoke with pt and she state that since she called this morning the pain management office called back and scheduled her for an appt tomorrow.

## 2024-03-28 NOTE — Progress Notes (Signed)
 PROVIDER NOTE: Interpretation of information contained herein should be left to medically-trained personnel. Specific patient instructions are provided elsewhere under "Patient Instructions" section of medical record. This document was created in part using AI and STT-dictation technology, any transcriptional errors that may result from this process are unintentional.  Patient: Vickie [name]  Service: E/M Encounter  Provider: Candi Chafe, MD  DOB: [birth year]  Delivery: Face-to-face  Specialty: Interventional Pain Management  MRN: [id number]  Setting: Ambulatory outpatient facility  Specialty designation: 09  Type: New Patient  Location: Outpatient office facility  PCP: Tona Francis, NP  DOS: 03/29/2024    Referring Prov.: Tona Francis, NP   Primary Reason(s) for Visit: Encounter for initial evaluation of one or more chronic problems (new to examiner) potentially causing chronic pain, and posing a threat to normal musculoskeletal function. (Level of risk: High) CC: Back Pain (Bilateral ) and Leg Pain (Left posterior )  HPI  Vickie [name] is a 58 y.o. year old, female patient, who comes for the first time to our practice referred by Tona Francis, NP for our initial evaluation of her chronic pain. She has History of endometrial ablation; Varicose veins with pain; Seasonal allergies; Hyperlipidemia; Vitamin D  deficiency; Chronic low back pain (1ry area of Pain) (Bilateral) w/o sciatica; Family history of breast cancer; Annual physical exam; Elevated LDL cholesterol level; Epistaxis; Hypertrophy of nasal turbinates; Chronic lower extremity pain (Left); Levoscoliosis; Lumbar radiculopathy; Spondylosis without myelopathy or radiculopathy, lumbar region; Other seasonal allergic rhinitis; Paresthesia of hand, bilateral; Polyp of nasal sinus; Chronic pain syndrome; Pharmacologic therapy; Disorder of skeletal system; Problems influencing health status; Chronic hip pain (Right); Radicular pain of  lower extremity (Left); Low back pain of over 3 months duration; Mechanical low back pain; Multifactorial low back pain; Low back pain potentially associated with radiculopathy; Lumbar facet joint pain; and Lumbar facet joint syndrome on their problem list. Today she comes in for evaluation of her Back Pain (Bilateral ) and Leg Pain (Left posterior )  Pain Assessment: Location: Lower, Right, Left Back Radiating: Radaites from lower back down back of left leg to the left ankle with prolonged standing and sitting Onset: More than a month ago Duration: Chronic pain Quality: Burning, Constant ("Annoying, pulling") Severity: 7 /10 (subjective, self-reported pain score)  Effect on ADL: Limits ADLS Timing: Constant Modifying factors: Heating pad, exercises, and meloxicam  BP: (!) 141/74  HR: 80  Onset and Duration: Present longer than 3 months Cause of pain: Unknown Severity: Getting worse, NAS-11 at its worse: 10/10, NAS-11 now: 7/10, and NAS-11 on the average: 10/10 Timing: Afternoon, Night, and After a period of immobility Aggravating Factors: Bending, Prolonged sitting, Prolonged standing, Twisting, and Working Alleviating Factors: Stretching, Hot packs, Warm showers or baths, and Walking Associated Problems: Spasms, Sweating, Pain that wakes patient up, and Pain that does not allow patient to sleep Quality of Pain: Aching, Annoying, Burning, Deep, Dreadful, Getting longer, Heavy, Nagging, Sharp, Shooting, Throbbing, and Uncomfortable Previous Examinations or Tests: Nerve block and X-rays Previous Treatments: Physical Therapy and Stretching exercises  Vickie [name] is being evaluated for possible interventional pain management therapies for the treatment of her chronic pain.  Discussed the use of AI scribe software for clinical note transcription with the patient, who gave verbal consent to proceed.  History of Present Illness   Vickie [name] is a 58 year old female with chronic back pain  who presents with severe back pain radiating to the left leg.  She has experienced severe back pain for several  years, which has recently worsened. Over the past week, the pain has been primarily in the lower back and radiates down the left leg, especially when sitting or standing for long periods. The pain in the back and leg is equally severe, with the leg pain typically starting first.  The pain began gradually without any specific incident or accident. She recalls being informed about issues with her L5 and L6 vertebrae, possibly involving a lack of cartilage, although she is uncertain of the exact details. She has not undergone any back surgeries.  She has participated in physical therapy intermittently over the past year, with the most recent session occurring two weeks ago. The therapy was initially prescribed for a week, during which she attended three sessions. She continues to perform exercises learned in therapy at the gym with her husband's assistance. However, she did not notice significant improvement from the therapy and felt it might have worsened her condition.  She has received transforaminal epidural steroid injections and facet joint injections. The first set of injections was administered when she experienced primarily leg pain, and the injections were given on both sides, although the left side was more problematic. The facet joint injections provided temporary relief, but the effects wore off, and she describes them as a 'Band-Aid.'  She experiences pain that starts from the back of the left leg and sometimes extends to the top of the foot. No leg weakness, but she occasionally experiences numbness in the foot. Her past medical history includes scoliosis and arthritis in the lower back. She also had a bunion removed from her left leg. Her left leg was previously evaluated for vein issues, which were treated, but the pain persisted.      Vickie [name] has been informed that this  initial visit was an evaluation only.  On the follow up appointment I will go over the results, including ordered tests and available interventional therapies. At that time she will have the opportunity to decide whether to proceed with offered therapies or not. In the event that Vickie [name] prefers avoiding interventional options, this will conclude our involvement in the case.  Medication management recommendations may be provided upon request.  Patient informed that diagnostic tests may be ordered to assist in identifying underlying causes, narrow the list of differential diagnoses and aid in determining candidacy for (or contraindications to) planned therapeutic interventions.  Historic Controlled Substance Pharmacotherapy Review  PMP and historical list of controlled substances: Gabapentin 300 mg capsule (30/month), 1 cap p.o. daily (# 30) (last filled on 01/02/2023) Most recently prescribed opioid analgesics: None MME/day: 0 mg/day  Historical Monitoring: The patient  reports no history of drug use. List of prior UDS Testing: No results found for: "MDMA", "COCAINSCRNUR", "PCPSCRNUR", "PCPQUANT", "CANNABQUANT", "THCU", "ETH", "CBDTHCR", "D8THCCBX", "D9THCCBX" Historical Background Evaluation: [place] PMP: PDMP reviewed during this encounter. Review of the past 46-months conducted.             PMP NARX Score Report:  Narcotic: 000 Sedative: 000 Stimulant: 000 [place] Department of public safety, offender search: Engineer, mining Information) Non-contributory Risk Assessment Profile: Aberrant behavior: None observed or detected today Risk factors for fatal opioid overdose: None identified today PMP NARX Overdose Risk Score: 260 Fatal overdose hazard ratio (HR): Calculation deferred Non-fatal overdose hazard ratio (HR): Calculation deferred Risk of opioid abuse or dependence: 0.7-3.0% with doses <= 36 MME/day and 6.1-26% with doses >= 120 MME/day. Substance use disorder (SUD) risk level: See below Personal  History of Substance Abuse (SUD-Substance use disorder):  Alcohol: Negative  Illegal Drugs: Negative  Rx Drugs: Negative  ORT Risk Level calculation: Low Risk  Opioid Risk Tool - 03/29/24 1415       Family History of Substance Abuse   Alcohol Negative    Illegal Drugs Negative    Rx Drugs Negative      Personal History of Substance Abuse   Alcohol Negative    Illegal Drugs Negative    Rx Drugs Negative      Age   Age between 61-45 years  No      Psychological Disease   Psychological Disease Negative    Depression Negative      Total Score   Opioid Risk Tool Scoring 0    Opioid Risk Interpretation Low Risk            ORT Scoring interpretation table:  Score <3 = Low Risk for SUD  Score between 4-7 = Moderate Risk for SUD  Score >8 = High Risk for Opioid Abuse   PHQ-2 Depression Scale:  Total score: 0  PHQ-2 Scoring interpretation table: (Score and probability of major depressive disorder)  Score 0 = No depression  Score 1 = 15.4% Probability  Score 2 = 21.1% Probability  Score 3 = 38.4% Probability  Score 4 = 45.5% Probability  Score 5 = 56.4% Probability  Score 6 = 78.6% Probability   PHQ-9 Depression Scale:  Total score: 0  PHQ-9 Scoring interpretation table:  Score 0-4 = No depression  Score 5-9 = Mild depression  Score 10-14 = Moderate depression  Score 15-19 = Moderately severe depression  Score 20-27 = Severe depression (2.4 times higher risk of SUD and 2.89 times higher risk of overuse)   Pharmacologic Plan: As per protocol, I have not taken over any controlled substance management, pending the results of ordered tests and/or consults.            Initial impression: Pending review of available data and ordered tests.  Meds   Current Outpatient Medications:    Cholecalciferol 1.25 MG (50000 UT) capsule, Take by mouth., Disp: , Rfl:    gabapentin (NEURONTIN) 300 MG capsule, Take 300 mg by mouth 3 (three) times daily., Disp: , Rfl:     levocetirizine (XYZAL) 5 MG tablet, Take 5 mg by mouth every evening., Disp: , Rfl:    meloxicam  (MOBIC ) 7.5 MG tablet, Take 1 tablet (7.5 mg total) by mouth daily., Disp: 30 tablet, Rfl: 0   montelukast (SINGULAIR) 10 MG tablet, Take by mouth., Disp: , Rfl:    Omega-3 Fatty Acids (FISH OIL) 300 MG CAPS, Take by mouth., Disp: , Rfl:    predniSONE  (DELTASONE ) 10 MG tablet, Take 4 tablets ( total 40 mg) by mouth for 2 days; take 3 tablets ( total 30 mg) by mouth for 2 days; take 2 tablets ( total 20 mg) by mouth for 1 day; take 1 tablet ( total 10 mg) by mouth for 1 day., Disp: 17 tablet, Rfl: 0  Imaging Review   Complexity Note: No results found under the [facility] electronic medical record.                         ROS  Cardiovascular: No reported cardiovascular signs or symptoms such as High blood pressure, coronary artery disease, abnormal heart rate or rhythm, heart attack, blood thinner therapy or heart weakness and/or failure Pulmonary or Respiratory: No reported pulmonary signs or symptoms such as wheezing and difficulty taking a  deep full breath (Asthma), difficulty blowing air out (Emphysema), coughing up mucus (Bronchitis), persistent dry cough, or temporary stoppage of breathing during sleep Neurological: No reported neurological signs or symptoms such as seizures, abnormal skin sensations, urinary and/or fecal incontinence, being born with an abnormal open spine and/or a tethered spinal cord Psychological-Psychiatric: No reported psychological or psychiatric signs or symptoms such as difficulty sleeping, anxiety, depression, delusions or hallucinations (schizophrenial), mood swings (bipolar disorders) or suicidal ideations or attempts Gastrointestinal: No reported gastrointestinal signs or symptoms such as vomiting or evacuating blood, reflux, heartburn, alternating episodes of diarrhea and constipation, inflamed or scarred liver, or pancreas or irrregular and/or infrequent bowel  movements Genitourinary: No reported renal or genitourinary signs or symptoms such as difficulty voiding or producing urine, peeing blood, non-functioning kidney, kidney stones, difficulty emptying the bladder, difficulty controlling the flow of urine, or chronic kidney disease Hematological: No reported hematological signs or symptoms such as prolonged bleeding, low or poor functioning platelets, bruising or bleeding easily, hereditary bleeding problems, low energy levels due to low hemoglobin or being anemic Endocrine: No reported endocrine signs or symptoms such as high or low blood sugar, rapid heart rate due to high thyroid levels, obesity or weight gain due to slow thyroid or thyroid disease Rheumatologic: No reported rheumatological signs and symptoms such as fatigue, joint pain, tenderness, swelling, redness, heat, stiffness, decreased range of motion, with or without associated rash Musculoskeletal: Negative for myasthenia gravis, muscular dystrophy, multiple sclerosis or malignant hyperthermia Work History: Working full time  Allergies  Ms. Frech is allergic to shellfish allergy.  Laboratory Chemistry Profile   Renal Lab Results  Component Value Date   BUN 12 03/29/2024   CREATININE 0.74 03/29/2024   BCR 16 03/29/2024   GFR 95.54 12/09/2023   GFRAA 106 10/21/2020   GFRNONAA 92 10/21/2020     Electrolytes Lab Results  Component Value Date   NA 139 03/29/2024   K 4.3 03/29/2024   CL 98 03/29/2024   CALCIUM 9.7 03/29/2024   MG 2.1 03/29/2024     Hepatic Lab Results  Component Value Date   AST 28 03/29/2024   ALT 15 12/09/2023   ALBUMIN 4.5 03/29/2024   ALKPHOS 76 03/29/2024     ID Lab Results  Component Value Date   HIV NON-REACTIVE 03/08/2024     Bone Lab Results  Component Value Date   VD25OH 51.83 12/09/2023   25OHVITD1 WILL FOLLOW 03/29/2024   25OHVITD2 WILL FOLLOW 03/29/2024   25OHVITD3 WILL FOLLOW 03/29/2024     Endocrine Lab Results  Component  Value Date   GLUCOSE 76 03/29/2024   HGBA1C 5.2 10/21/2020   TSH 1.99 12/09/2023     Neuropathy Lab Results  Component Value Date   VITAMINB12 1,086 03/29/2024   FOLATE 19.3 12/09/2023   HGBA1C 5.2 10/21/2020   HIV NON-REACTIVE 03/08/2024     CNS No results found for: "COLORCSF", "APPEARCSF", "RBCCOUNTCSF", "WBCCSF", "POLYSCSF", "LYMPHSCSF", "EOSCSF", "PROTEINCSF", "GLUCCSF", "JCVIRUS", "CSFOLI", "IGGCSF", "LABACHR", "ACETBL"   Inflammation (CRP: Acute  ESR: Chronic) Lab Results  Component Value Date   CRP 2 03/29/2024   ESRSEDRATE 5 03/29/2024     Rheumatology No results found for: "RF", "ANA", "LABURIC", "URICUR", "LYMEIGGIGMAB", "LYMEABIGMQN", "HLAB27"   Coagulation Lab Results  Component Value Date   PLT 292.0 12/09/2023     Cardiovascular Lab Results  Component Value Date   HGB 13.3 12/09/2023   HCT 40.4 12/09/2023     Screening Lab Results  Component Value Date   HIV NON-REACTIVE  03/08/2024     Cancer No results found for: "CEA", "CA125", "LABCA2"   Allergens No results found for: "ALMOND", "APPLE", "ASPARAGUS", "AVOCADO", "BANANA", "BARLEY", "BASIL", "BAYLEAF", "GREENBEAN", "LIMABEAN", "WHITEBEAN", "BEEFIGE", "REDBEET", "BLUEBERRY", "BROCCOLI", "CABBAGE", "MELON", "CARROT", "CASEIN", "CASHEWNUT", "CAULIFLOWER", "CELERY"     Note: Lab results reviewed.  PFSH  Drug: Vickie [name]  reports no history of drug use. Alcohol:  reports current alcohol use. Tobacco:  reports that she quit smoking about 17 years ago. Her smoking use included cigarettes. She has never used smokeless tobacco. Medical:  has a past medical history of Allergy (2020), Arthritis, Asthma (childhood), BRCA negative (08/2021), Family history of breast cancer, Genetic testing of female, and Increased risk of breast cancer (08/2021). Family: family history includes Asthma in her brother and sister; Breast cancer in her maternal grandmother, mother, sister, and another family member; Cancer in  her brother, brother, mother, sister, and sister; Prostate cancer (age of onset: 25) in her brother; Prostate cancer (age of onset: 90) in her brother; Varicose Veins in her mother.  Past Surgical History:  Procedure Laterality Date   ABLATION Bilateral 2007   BREAST BIOPSY Left    benign   BREAST BIOPSY Left    benign   BREAST BIOPSY Right 07/13/2021   u/s bx axilla hrdro coil shape 3 neg   BREAST BIOPSY Right 03/24/2022   mri bx/ neg   CESAREAN SECTION     COLONOSCOPY WITH PROPOFOL  N/A 01/06/2024   Procedure: COLONOSCOPY WITH PROPOFOL ;  Surgeon: Selena Daily, MD;  Location: [facility];  Service: Endoscopy;  Laterality: N/A;   TUBAL LIGATION     Active Ambulatory Problems    Diagnosis Date Noted   History of endometrial ablation 09/05/2017   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   Seasonal allergies 09/10/2022   Hyperlipidemia 12/09/2023   Vitamin D  deficiency 06/10/2022   Chronic low back pain (1ry area of Pain) (Bilateral) w/o sciatica 12/16/2023   Family history of breast cancer 12/16/2023   Annual physical exam 03/08/2024   Elevated LDL cholesterol level 09/10/2022   Epistaxis 03/28/2024   Hypertrophy of nasal turbinates 03/28/2024   Chronic lower extremity pain (Left) 04/15/2020   Levoscoliosis 06/23/2022   Lumbar radiculopathy 06/23/2022   Spondylosis without myelopathy or radiculopathy, 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   Other seasonal allergic rhinitis 03/28/2024   Paresthesia of hand, bilateral 09/21/2022   Polyp of nasal sinus 03/28/2024   Chronic pain syndrome 03/28/2024   Pharmacologic therapy 03/28/2024   Disorder of skeletal system 03/28/2024   Problems influencing health status 03/28/2024   Chronic hip pain (Right) 03/29/2024   Radicular pain of lower extremity (Left) 03/29/2024   Low back pain of over 3 months duration 03/29/2024   Mechanical low back pain 03/29/2024   Multifactorial low back pain 03/29/2024   Low back pain potentially associated  with radiculopathy 03/29/2024   Lumbar facet joint pain 03/29/2024   Lumbar facet joint syndrome 03/29/2024   Resolved Ambulatory Problems    Diagnosis Date Noted   No Resolved Ambulatory Problems   Past Medical History:  Diagnosis Date   Allergy 2020   Arthritis    Asthma childhood   BRCA negative 08/2021   Genetic testing of female    Increased risk of breast cancer 08/2021   Constitutional Exam  General appearance: Well nourished, well developed, and well hydrated. In no apparent acute distress Vitals:   03/29/24 1408  BP: (!) 141/74  Pulse: 80  Resp: 16  Temp: 98.4 F (36.9 C)  TempSrc: Temporal  SpO2: 100%  Weight: 158 lb (71.7 kg)  Height: 5\' 8"  (1.727 m)   BMI Assessment: Estimated body mass index is 24.02 kg/m as calculated from the following:   Height as of this encounter: 5\' 8"  (1.727 m).   Weight as of this encounter: 158 lb (71.7 kg).  BMI interpretation table: BMI level Category Range association with higher incidence of chronic pain  <18 kg/m2 Underweight   18.5-24.9 kg/m2 Ideal body weight   25-29.9 kg/m2 Overweight Increased incidence by 20%  30-34.9 kg/m2 Obese (Class I) Increased incidence by 68%  35-39.9 kg/m2 Severe obesity (Class II) Increased incidence by 136%  >40 kg/m2 Extreme obesity (Class III) Increased incidence by 254%   Patient's current BMI Ideal Body weight  Body mass index is 24.02 kg/m. Ideal body weight: 63.9 kg (140 lb 14 oz) Adjusted ideal body weight: 67 kg (147 lb 11.6 oz)   BMI Readings from Last 4 Encounters:  03/29/24 24.02 kg/m  03/08/24 24.33 kg/m  02/02/24 24.48 kg/m  01/06/24 22.20 kg/m   Wt Readings from Last 4 Encounters:  03/29/24 158 lb (71.7 kg)  03/08/24 160 lb (72.6 kg)  02/02/24 161 lb (73 kg)  01/06/24 146 lb (66.2 kg)    Psych/Mental status: Alert, oriented x 3 (person, place, & time)       Eyes: PERLA Respiratory: No evidence of acute respiratory distress  Physical Exam   MUSCULOSKELETAL:  Pain in lower back on flexion. Left paraspinal muscles bulge more than right on flexion. Scoliosis present. Pain in lower back on right leg raise past 45 degrees. Pain in right hip joint on external rotation. Left hip joint normal on external rotation. Slight back pain on head lift. Normal heel walk. Normal toe walk. Pain on hyperextension of spine. Pain on right rotation of spine.       Assessment  Primary Diagnosis & Pertinent Problem List: The primary encounter diagnosis was Chronic low back pain (1ry area of Pain) (Bilateral) w/o sciatica. Diagnoses of Chronic lower extremity pain (Left), Chronic hip pain (Right), Levoscoliosis, Spondylosis without myelopathy or radiculopathy, lumbar region, Radicular pain of lower extremity (Left), Low back pain of over 3 months duration, Mechanical low back pain, Multifactorial low back pain, Low back pain potentially associated with radiculopathy, Lumbar facet joint pain, Lumbar facet joint syndrome, Chronic pain syndrome, Pharmacologic therapy, Disorder of skeletal system, and Problems influencing health status were also pertinent to this visit.  Visit Diagnosis (New problems to examiner): 1. Chronic low back pain (1ry area of Pain) (Bilateral) w/o sciatica   2. Chronic lower extremity pain (Left)   3. Chronic hip pain (Right)   4. Levoscoliosis   5. Spondylosis without myelopathy or radiculopathy, lumbar region   6. Radicular pain of lower extremity (Left)   7. Low back pain of over 3 months duration   8. Mechanical low back pain   9. Multifactorial low back pain   10. Low back pain potentially associated with radiculopathy   11. Lumbar facet joint pain   12. Lumbar facet joint syndrome   13. Chronic pain syndrome   14. Pharmacologic therapy   15. Disorder of skeletal system   16. Problems influencing health status    Plan of Care (Initial workup plan)  Note: Vickie [name] was reminded that as per protocol, today's visit has been an evaluation only.  We have not taken over the patient's controlled substance management.  Problem-specific plan: Assessment and Plan    Low back pain with  left leg radiculopathy   Chronic low back pain with radiculopathy extends down the left leg, severe and worsened by prolonged sitting or standing. Pain distribution suggests L5 nerve involvement, possibly due to foraminal stenosis at L4-5 or L5-S1. Previous transforaminal epidural steroid injections provided temporary relief, indicating an inflammatory component. No significant leg weakness is present. The differential includes mechanical versus inflammatory etiology. Steroid injections can reduce inflammation temporarily, but persistent pain may indicate a mechanical issue requiring different treatment. Order flexion and extension x-rays of the lumbar spine to assess for instability. Review previous MRI and x-ray results from 2023. Schedule a follow-up appointment in two weeks to discuss findings and treatment options.  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 Previous facet joint injections provided temporary relief, suggesting an inflammatory component. Facet joint injections can confirm inflammation if relief is achieved, but persistent pain may indicate a mechanical issue requiring different treatment. Order flexion and extension x-rays of the lumbar spine to assess for instability. Schedule a follow-up appointment in two weeks to discuss findings and treatment options.  Scoliosis   Scoliosis is present, contributing to asymmetrical muscle bulging during physical examination.       Lab Orders         Compliance Drug Analysis, Ur         Comp. Metabolic Panel (12)         Magnesium         Vitamin B12         Sedimentation rate         25-Hydroxy vitamin D  Lcms D2+D3         C-reactive protein     Imaging Orders         DG Lumbar  Spine Complete W/Bend         DG HIP UNILAT W OR W/O PELVIS 2-3 VIEWS RIGHT         MR LUMBAR SPINE WO CONTRAST     Referral Orders  No referral(s) requested today   Procedure Orders    No procedure(s) ordered today   Pharmacotherapy (current): Medications ordered:  No orders of the defined types were placed in this encounter.  Medications administered during this visit: Vickie [name] had no medications administered during this visit.   Analgesic Pharmacotherapy:  Opioid Analgesics: For patients currently taking or requesting to take opioid analgesics, in accordance with [facility]  Medical Board Guidelines, we will assess their risks and indications for the use of these substances. After completing our evaluation, we may offer recommendations, but we no longer take patients for medication management. The prescribing physician will ultimately decide, based on his/her training and level of comfort whether to adopt any of the recommendations, including whether or not to prescribe such medicines.  Membrane stabilizer: To be determined at a later time  Muscle relaxant: To be determined at a later time  NSAID: To be determined at a later time  Other analgesic(s): To be determined at a later time   Interventional management options: Vickie [name] was informed that there is no guarantee that she would be a candidate for interventional therapies. The decision will be based on the results of diagnostic studies, as well as Vickie [name]'s risk profile.  Procedure(s) under consideration:  Pending results of ordered studies     Interventional Therapies  Risk Factors  Considerations  Medical Comorbidities:     Planned  Pending:      Under consideration:   Pending   Completed:   None at this time   Therapeutic  Palliative (PRN) options:   None established   Completed by other providers:   Diagnostic left L4-5 & L5-S1 TFESI x1 (07/12/2022) by Janeece Mechanic, MD ([facility])  Diagnostic bilateral L4-5 & L5-S1 Facet MBB x2 (08/30/2022, 10/18/2022) by Janeece Mechanic, MD ([facility])      Provider-requested follow-up: Return in about 2 weeks (around 04/12/2024) for ( ), Eval-day (M,W), (F2F), 2nd Visit, for review of ordered tests.  No future appointments.  I discussed the assessment and treatment plan with the patient. The patient was provided an opportunity to ask questions and all were answered. The patient agreed with the plan and demonstrated an understanding of the instructions.  Patient advised to call back or seek an in-person evaluation if the symptoms or condition worsens.  Duration of encounter: 45 minutes.  Total time on encounter, as per AMA guidelines included both the face-to-face and non-face-to-face time personally spent by the physician and/or other qualified health care professional(s) on the day of the encounter (includes time in activities that require the physician or other qualified health care professional and does not include time in activities normally performed by clinical staff). Physician's time may include the following activities when performed: Preparing to see the patient (e.g., pre-charting review of records, searching for previously ordered imaging, lab work, and nerve conduction tests) Review of prior analgesic pharmacotherapies. Reviewing PMP Interpreting ordered tests (e.g., lab work, imaging, nerve conduction tests) Performing post-procedure evaluations, including interpretation of diagnostic procedures Obtaining and/or reviewing separately obtained history Performing a medically appropriate examination and/or evaluation Counseling and educating the patient/family/caregiver Ordering medications, tests, or procedures Referring and communicating with other health care professionals (when not separately reported) Documenting clinical information in the electronic or other health  record Independently interpreting results (not separately reported) and communicating results to the patient/ family/caregiver Care coordination (not separately reported)  Note by: Candi Chafe, MD (TTS and AI technology used. I apologize for any typographical errors that were not detected and corrected.) Date: 03/29/2024; Time: 5:22 AM

## 2024-03-29 ENCOUNTER — Ambulatory Visit
Admission: RE | Admit: 2024-03-29 | Discharge: 2024-03-29 | Disposition: A | Discharge status: Home / Self Care | Source: Ambulatory Visit | Attending: Pain Medicine | Admitting: Pain Medicine

## 2024-03-29 ENCOUNTER — Encounter: Payer: Self-pay | Admitting: Pain Medicine

## 2024-03-29 ENCOUNTER — Ambulatory Visit: Admitting: Pain Medicine

## 2024-03-29 VITALS — BP 141/74 | HR 80 | Temp 98.4°F | Resp 16 | Ht 68.0 in | Wt 158.0 lb

## 2024-03-29 DIAGNOSIS — M545 Low back pain, unspecified: Secondary | ICD-10-CM | POA: Insufficient documentation

## 2024-03-29 DIAGNOSIS — Z79899 Other long term (current) drug therapy: Secondary | ICD-10-CM | POA: Insufficient documentation

## 2024-03-29 DIAGNOSIS — G8929 Other chronic pain: Secondary | ICD-10-CM

## 2024-03-29 DIAGNOSIS — M4180 Other forms of scoliosis, site unspecified: Secondary | ICD-10-CM | POA: Insufficient documentation

## 2024-03-29 DIAGNOSIS — M899 Disorder of bone, unspecified: Secondary | ICD-10-CM | POA: Insufficient documentation

## 2024-03-29 DIAGNOSIS — M25551 Pain in right hip: Secondary | ICD-10-CM | POA: Insufficient documentation

## 2024-03-29 DIAGNOSIS — M47816 Spondylosis without myelopathy or radiculopathy, lumbar region: Secondary | ICD-10-CM

## 2024-03-29 DIAGNOSIS — M79605 Pain in left leg: Secondary | ICD-10-CM

## 2024-03-29 DIAGNOSIS — G894 Chronic pain syndrome: Secondary | ICD-10-CM | POA: Insufficient documentation

## 2024-03-29 DIAGNOSIS — M5410 Radiculopathy, site unspecified: Secondary | ICD-10-CM

## 2024-03-29 DIAGNOSIS — M5459 Other low back pain: Secondary | ICD-10-CM | POA: Diagnosis present

## 2024-03-29 DIAGNOSIS — Z789 Other specified health status: Secondary | ICD-10-CM | POA: Insufficient documentation

## 2024-03-29 NOTE — Progress Notes (Signed)
 Safety precautions to be maintained throughout the outpatient stay will include: orient to surroundings, keep bed in low position, maintain call bell within reach at all times, provide assistance with transfer out of bed and ambulation.

## 2024-03-29 NOTE — Telephone Encounter (Signed)
 Noted! Thank you

## 2024-04-01 LAB — COMPLIANCE DRUG ANALYSIS, UR

## 2024-04-02 NOTE — Addendum Note (Signed)
 Addended by: Renaldo Caroli A on: 04/02/2024 07:48 AM   Modules accepted: Orders

## 2024-04-05 ENCOUNTER — Telehealth: Payer: Self-pay

## 2024-04-05 NOTE — Telephone Encounter (Signed)
 Copied from CRM [id number]. Topic: Clinical - Medical Advice >> Apr 05, 2024 10:24 AM Albertha Alosa wrote: Reason for CRM: Patient called in stating she had some blood work done and it was high, she wanted to know if NP Charanpreet could look them over and see if she needs to come in for a appointment

## 2024-04-06 ENCOUNTER — Ambulatory Visit
Admission: RE | Admit: 2024-04-06 | Discharge: 2024-04-06 | Disposition: A | Discharge status: Home / Self Care | Source: Ambulatory Visit | Attending: Pain Medicine | Admitting: Pain Medicine

## 2024-04-06 DIAGNOSIS — G8929 Other chronic pain: Secondary | ICD-10-CM

## 2024-04-06 DIAGNOSIS — G894 Chronic pain syndrome: Secondary | ICD-10-CM

## 2024-04-06 DIAGNOSIS — M4180 Other forms of scoliosis, site unspecified: Secondary | ICD-10-CM

## 2024-04-06 DIAGNOSIS — M47816 Spondylosis without myelopathy or radiculopathy, lumbar region: Secondary | ICD-10-CM

## 2024-04-06 DIAGNOSIS — M545 Low back pain, unspecified: Secondary | ICD-10-CM

## 2024-04-06 DIAGNOSIS — M5459 Other low back pain: Secondary | ICD-10-CM

## 2024-04-06 DIAGNOSIS — M5410 Radiculopathy, site unspecified: Secondary | ICD-10-CM

## 2024-04-06 LAB — COMP. METABOLIC PANEL (12)
AST: 28 IU/L (ref 0–40)
Albumin: 4.5 g/dL (ref 3.8–4.9)
Alkaline Phosphatase: 76 IU/L (ref 44–121)
BUN/Creatinine Ratio: 16 (ref 9–23)
BUN: 12 mg/dL (ref 6–24)
Bilirubin Total: 0.2 mg/dL (ref 0.0–1.2)
Calcium: 9.7 mg/dL (ref 8.7–10.2)
Chloride: 98 mmol/L (ref 96–106)
Creatinine, Ser: 0.74 mg/dL (ref 0.57–1.00)
Globulin, Total: 2.7 g/dL (ref 1.5–4.5)
Glucose: 76 mg/dL (ref 70–99)
Potassium: 4.3 mmol/L (ref 3.5–5.2)
Sodium: 139 mmol/L (ref 134–144)
Total Protein: 7.2 g/dL (ref 6.0–8.5)
eGFR: 95 mL/min/{1.73_m2} (ref >59)

## 2024-04-06 LAB — C-REACTIVE PROTEIN: CRP: 2 mg/L (ref 0–10)

## 2024-04-06 LAB — 25-HYDROXY VITAMIN D LCMS D2+D3
25-Hydroxy, Vitamin D-2: <1 ng/mL
25-Hydroxy, Vitamin D-3: 67 ng/mL
25-Hydroxy, Vitamin D: 67 ng/mL

## 2024-04-06 LAB — MAGNESIUM: Magnesium: 2.1 mg/dL (ref 1.6–2.3)

## 2024-04-06 LAB — VITAMIN B12: Vitamin B-12: 1086 pg/mL (ref 232–1245)

## 2024-04-06 LAB — SEDIMENTATION RATE: Sed Rate: 5 mm/h (ref 0–40)

## 2024-04-06 NOTE — Telephone Encounter (Signed)
 Informed pt to reach out to ordering provider to go over labs. Informed pt if she doesn't hear back from provider to schedule an appointment with pcp to discuss. Pt verbalized understanding

## 2024-05-09 DIAGNOSIS — G8929 Other chronic pain: Secondary | ICD-10-CM | POA: Insufficient documentation

## 2024-05-16 ENCOUNTER — Encounter: Payer: Self-pay | Admitting: Pain Medicine

## 2024-05-16 ENCOUNTER — Ambulatory Visit: Attending: Pain Medicine | Admitting: Pain Medicine

## 2024-05-16 VITALS — BP 123/75 | HR 87 | Temp 98.4°F | Ht 68.0 in | Wt 155.0 lb

## 2024-05-16 DIAGNOSIS — M545 Low back pain, unspecified: Secondary | ICD-10-CM | POA: Insufficient documentation

## 2024-05-16 DIAGNOSIS — R937 Abnormal findings on diagnostic imaging of other parts of musculoskeletal system: Secondary | ICD-10-CM | POA: Diagnosis present

## 2024-05-16 DIAGNOSIS — M5459 Other low back pain: Secondary | ICD-10-CM | POA: Insufficient documentation

## 2024-05-16 DIAGNOSIS — M5416 Radiculopathy, lumbar region: Secondary | ICD-10-CM | POA: Diagnosis not present

## 2024-05-16 DIAGNOSIS — M5410 Radiculopathy, site unspecified: Secondary | ICD-10-CM | POA: Insufficient documentation

## 2024-05-16 DIAGNOSIS — M25551 Pain in right hip: Secondary | ICD-10-CM | POA: Insufficient documentation

## 2024-05-16 DIAGNOSIS — G8929 Other chronic pain: Secondary | ICD-10-CM | POA: Diagnosis present

## 2024-05-16 DIAGNOSIS — M79605 Pain in left leg: Secondary | ICD-10-CM | POA: Insufficient documentation

## 2024-05-16 NOTE — Patient Instructions (Addendum)

## 2024-05-16 NOTE — Progress Notes (Signed)
 PROVIDER NOTE: Interpretation of information contained herein should be left to medically-trained personnel. Specific patient instructions are provided elsewhere under Patient Instructions section of medical record. This document was created in part using AI and STT-dictation technology, any transcriptional errors that may result from this process are unintentional.  Patient: Vickie [name]  Service: E/M   PCP: Vincente Saber, NP  DOB: May 10, 1966  DOS: 05/16/2024  Provider: Eric DELENA Como, MD  MRN: [id number]  Delivery: Face-to-face  Specialty: Interventional Pain Management  Type: Established Patient  Setting: Ambulatory outpatient facility  Specialty designation: 09  Referring Prov.: Vincente Saber, NP  Location: Outpatient office facility       Primary Reason(s) for Visit: Encounter for evaluation before starting new chronic pain management plan of care (Level of risk: moderate) CC: Back Pain (upper)  HPI  Vickie [name] is a 58 y.o. year old, female patient, who comes today for a follow-up evaluation to review the test results and decide on a treatment plan. She has History of endometrial ablation; Varicose veins with pain; Seasonal allergies; Hyperlipidemia; Vitamin D  deficiency; Chronic low back pain (1ry area of Pain) (Bilateral) w/o sciatica; Family history of breast cancer; Annual physical exam; Elevated LDL cholesterol level; Epistaxis; Hypertrophy of nasal turbinates; Chronic lower extremity pain (Left); Levoscoliosis; Lumbar radiculopathy; Spondylosis without myelopathy or radiculopathy, lumbar region; Other seasonal allergic rhinitis; Paresthesia of hand, bilateral; Polyp of nasal sinus; Chronic pain syndrome; Pharmacologic therapy; Disorder of skeletal system; Problems influencing health status; Chronic hip pain (Right); Radicular pain of lower extremity (Left); Low back pain of over 3 months duration; Mechanical low back pain; Multifactorial low back pain; Low back pain potentially  associated with radiculopathy; Lumbar facet joint pain; Lumbar facet joint syndrome; Chronic upper back pain (between shoulder blades); and Abnormal MRI, lumbar spine (05/06/2024) on their problem list. Her primarily concern today is the Back Pain (upper)  Pain Assessment: Location: Upper Back Radiating: pain radiaties down left arm, mostly at night Onset: More than a month ago Duration: Chronic pain Quality: Aching, Burning, Throbbing Severity: 4 /10 (subjective, self-reported pain score)  Effect on ADL: limits my daily activities Timing: Intermittent Modifying factors: heat pad, meds BP: 123/75  HR: 87  Vickie [name] comes in today for a follow-up visit after her initial evaluation on 03/29/2024. Today we went over the results of her tests. These were explained in Layman's terms. During today's appointment we went over my diagnostic impression, as well as the proposed treatment plan.  Review of initial evaluation (03/29/2024):  Vickie [name] is a 58 year old female with chronic back pain who presents with severe back pain radiating to the left leg.   She has experienced severe back pain for several years, which has recently worsened. Over the past week, the pain has been primarily in the lower back and radiates down the left leg, especially when sitting or standing for long periods. The pain in the back and leg is equally severe, with the leg pain typically starting first.   The pain began gradually without any specific incident or accident. She recalls being informed about issues with her L5 and L6 vertebrae, possibly involving a lack of cartilage, although she is uncertain of the exact details. She has not undergone any back surgeries.   She has participated in physical therapy intermittently over the past year, with the most recent session occurring two weeks ago. The therapy was initially prescribed for a week, during which she attended three sessions. She continues to perform  exercises  learned in therapy at the gym with her husband's assistance. However, she did not notice significant improvement from the therapy and felt it might have worsened her condition.   She has received transforaminal epidural steroid injections and facet joint injections. The first set of injections was administered when she experienced primarily leg pain, and the injections were given on both sides, although the left side was more problematic. The facet joint injections provided temporary relief, but the effects wore off, and she describes them as a 'Band-Aid.'   She experiences pain that starts from the back of the left leg and sometimes extends to the top of the foot. No leg weakness, but she occasionally experiences numbness in the foot. Her past medical history includes scoliosis and arthritis in the lower back. She also had a bunion removed from her left leg. Her left leg was previously evaluated for vein issues, which were treated, but the pain persisted.  Review of diagnostic test ordered on 03/29/2024:  Diagnostic lab work: Vitamin D  levels, C-reactive protein, comprehensive metabolic panel, magnesium, sed rate, and vitamin B12 levels were all within normal limits.  UDS was negative for any illicit substances. Diagnostic imaging: Diagnostic x-rays of the right hip were read as negative.  Diagnostic x-rays of the lumbar spine with bending views demonstrated anterior spurring at L3 and L4 with degenerative joint changes of the lumbar spine.  Scoliosis.  Multilevel mild narrowing of intervertebral spaces.  Mild to moderate facet joint sclerosis throughout the lumbar spine.  No reported evidence of lumbar spine instability.  Diagnostic MRI of the lumbar spine shows levoscoliosis with the apex at L3.  Moderate right L3-4 neuroforaminal stenosis.  Left extraforaminal disc component at L2-3 contacting the exiting L2 nerve root.  Left extraforaminal disc bulge at L5-S1 contacting the exiting L5 nerve  root.  Bilateral lateral recess narrowing at L3-4 and L4-5 and left lateral recess narrowing at L5-S1.  Discussed the use of AI scribe software for clinical note transcription with the patient, who gave verbal consent to proceed.  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  She experiences worsening pain in her lower back and left leg, with radiation down the left leg towards the front, but not extending into the foot. The pain primarily affects the left leg, with occasional pain in the right hip when lying on that side for extended periods. The pain in the left leg extends to the calf but not into the foot.  She has a history of lumbar spine issues, including scoliosis, facet joint arthritis, and disc bulges. Recent imaging studies, including x-rays and an MRI, show mild to moderate facet joint sclerosis, a grade one retrolisthesis of L2 over L3, and disc bulges at multiple levels, with some narrowing in the lateral recesses and neuroforaminal stenosis. No central spinal stenosis is present.  She completed a course of oral steroids without symptom relief. She has previously undergone physical therapy, which she found similar to her own stretching exercises at the gym. She did not take methocarbamol, a muscle relaxant, as part of her treatment.  Recently, she developed new pain in the upper back between her shoulder blades, radiating down her right arm to the elbow. This pain has been present for about eight days to two weeks. She describes a sensation of weakness in the right arm, particularly when elevating it, but denies any pain, numbness, or weakness  in the hand. No neck pain or pain with neck movements.      Patient presented with interventional treatment options. Vickie [name] was informed that I will not be providing medication management. Pharmacotherapy evaluation including recommendations  may be offered, if specifically requested.   Controlled Substance Pharmacotherapy Assessment REMS (Risk Evaluation and Mitigation Strategy)  None MME/day: 0 mg/day   Pill Count: None expected due to no prior prescriptions written by our practice. Vickie Dorothe LABOR, Vickie [name]  05/16/2024  2:36 PM  Sign when Signing Visit Safety precautions to be maintained throughout the outpatient stay will include: orient to surroundings, keep bed in low position, maintain call bell within reach at all times, provide assistance with transfer out of bed and ambulation.     Pharmacokinetics: Liberation and absorption (onset of action): WNL Distribution (time to peak effect): WNL Metabolism and excretion (duration of action): WNL         Pharmacodynamics: Desired effects: Analgesia: Mrs. Greenberger reports >50% benefit. Functional ability: Patient reports that medication allows her to accomplish basic ADLs Clinically meaningful improvement in function (CMIF): Sustained CMIF goals met Perceived effectiveness: Described as relatively effective, allowing for increase in activities of daily living (ADL) Undesirable effects: Side-effects or Adverse reactions: None reported Monitoring: [place] PMP: PDMP not reviewed this encounter. Online review of the past 64-month period previously conducted. Not applicable at this point since we have not taken over the patient's medication management yet. List of other Serum/Urine Drug Screening Test(s):  No results found for: AMPHSCRSER, BARBSCRSER, BENZOSCRSER, COCAINSCRSER, COCAINSCRNUR, PCPSCRSER, THCSCRSER, THCU, CANNABQUANT, OPIATESCRSER, OXYSCRSER, PROPOXSCRSER, ETH, CBDTHCR, D8THCCBX, D9THCCBX List of all UDS test(s) done:  Lab Results  Component Value Date   SUMMARY FINAL 03/29/2024   Last UDS on record: Summary  Date Value Ref Range Status  03/29/2024 FINAL  Final    Comment:     ==================================================================== Compliance Drug Analysis, Ur ==================================================================== Specimen Alert Note: Urinary creatinine is low; ability to detect some drugs may be compromised. Interpret results with caution. (Creatinine) ==================================================================== Test                             Result       Flag       Units  Drug Absent but Declared for Prescription Verification   Gabapentin                     Not Detected UNEXPECTED ==================================================================== Test                      Result    Flag   Units      Ref Range   Creatinine              19        LL     mg/dL      >=79 ==================================================================== Declared Medications:  The flagging and interpretation on this report are based on the  following declared medications.  Unexpected results may arise from  inaccuracies in the declared medications.   **Note: The testing scope of this panel includes these medications:   Gabapentin (Neurontin)   **Note: The testing scope of this panel does not include the  following reported medications:   Cholecalciferol  Fish Oil  Levocetirizine (Xyzal)  Meloxicam  (Mobic )  Montelukast (Singulair)  Prednisone  (Deltasone ) ==================================================================== For clinical consultation, please call [phone]. ====================================================================    UDS interpretation: No unexpected findings.  Medication Assessment Form: Not applicable. No opioids. Treatment compliance: Not applicable Risk Assessment Profile: Aberrant behavior: See initial evaluations. None observed or detected today Comorbid factors increasing risk of overdose: See initial evaluation. No additional risks detected today Opioid risk tool (ORT):      03/29/2024    2:15 PM  Opioid Risk   Alcohol 0  Illegal Drugs 0  Rx Drugs 0  Alcohol 0  Illegal Drugs 0  Rx Drugs 0  Age between 16-45 years  0  Psychological Disease 0  Depression 0  Opioid Risk Tool Scoring 0  Opioid Risk Interpretation Low Risk    ORT Scoring interpretation table:  Score <3 = Low Risk for SUD  Score between 4-7 = Moderate Risk for SUD  Score >8 = High Risk for Opioid Abuse   Risk of substance use disorder (SUD): Low  Risk Mitigation Strategies:  Patient opioid safety counseling: No controlled substances prescribed. Patient-Prescriber Agreement (PPA): No agreement signed.  Controlled substance notification to other providers: None required. No opioid therapy.  Pharmacologic Plan: Non-opioid analgesic therapy offered. Interventional alternatives discussed.             Laboratory Chemistry Profile   Renal Lab Results  Component Value Date   BUN 12 03/29/2024   CREATININE 0.74 03/29/2024   BCR 16 03/29/2024   GFR 95.54 12/09/2023   GFRAA 106 10/21/2020   GFRNONAA 92 10/21/2020     Electrolytes Lab Results  Component Value Date   NA 139 03/29/2024   K 4.3 03/29/2024   CL 98 03/29/2024   CALCIUM 9.7 03/29/2024   MG 2.1 03/29/2024     Hepatic Lab Results  Component Value Date   AST 28 03/29/2024   ALT 15 12/09/2023   ALBUMIN 4.5 03/29/2024   ALKPHOS 76 03/29/2024     ID Lab Results  Component Value Date   HIV NON-REACTIVE 03/08/2024     Bone Lab Results  Component Value Date   VD25OH 51.83 12/09/2023   25OHVITD1 67 03/29/2024   25OHVITD2 <1.0 03/29/2024   25OHVITD3 67 03/29/2024     Endocrine Lab Results  Component Value Date   GLUCOSE 76 03/29/2024   HGBA1C 5.2 10/21/2020   TSH 1.99 12/09/2023     Neuropathy Lab Results  Component Value Date   VITAMINB12 1,086 03/29/2024   FOLATE 19.3 12/09/2023   HGBA1C 5.2 10/21/2020   HIV NON-REACTIVE 03/08/2024     CNS No results found for: COLORCSF, APPEARCSF,  RBCCOUNTCSF, WBCCSF, POLYSCSF, LYMPHSCSF, EOSCSF, PROTEINCSF, GLUCCSF, JCVIRUS, CSFOLI, IGGCSF, LABACHR, ACETBL   Inflammation (CRP: Acute  ESR: Chronic) Lab Results  Component Value Date   CRP 2 03/29/2024   ESRSEDRATE 5 03/29/2024     Rheumatology No results found for: RF, ANA, LABURIC, URICUR, LYMEIGGIGMAB, LYMEABIGMQN, HLAB27   Coagulation Lab Results  Component Value Date   PLT 292.0 12/09/2023     Cardiovascular Lab Results  Component Value Date   HGB 13.3 12/09/2023   HCT 40.4 12/09/2023     Screening Lab Results  Component Value Date   HIV NON-REACTIVE 03/08/2024     Cancer No results found for: CEA, CA125, LABCA2   Allergens No results found for: ALMOND, APPLE, ASPARAGUS, AVOCADO, BANANA, BARLEY, BASIL, BAYLEAF, GREENBEAN, LIMABEAN, WHITEBEAN, BEEFIGE, REDBEET, BLUEBERRY, BROCCOLI, CABBAGE, MELON, CARROT, CASEIN, CASHEWNUT, CAULIFLOWER, CELERY     Note: Lab results reviewed.  Recent Diagnostic Imaging Review  Cervical Imaging: Cervical MR wo contrast: No results found for this or any previous visit.  Cervical  MR wo contrast: No results found for this or any previous visit.  Cervical CT wo contrast: No results found for this or any previous visit.  Cervical DG Bending/F/E views: No results found for this or any previous visit.   Shoulder Imaging: Shoulder-R MR wo contrast: No results found for this or any previous visit.  Shoulder-L MR wo contrast: No results found for this or any previous visit.  Shoulder-R DG: No results found for this or any previous visit.  Shoulder-L DG: No results found for this or any previous visit.   Thoracic Imaging: Thoracic MR wo contrast: No results found for this or any previous visit.  Thoracic MR wo contrast: No results found for this or any previous visit.  Thoracic CT wo contrast: No results found for this or any previous  visit.  Thoracic DG 4 views: No results found for this or any previous visit.  Thoracic DG w/swimmers view: No results found for this or any previous visit.   Lumbosacral Imaging: Lumbar MR wo contrast: Results for orders placed during the hospital encounter of 04/06/24  MR LUMBAR SPINE WO CONTRAST  Narrative CLINICAL DATA:  Low back pain  EXAM: MRI LUMBAR SPINE WITHOUT CONTRAST  TECHNIQUE: Multiplanar, multisequence MR imaging of the lumbar spine was performed. No intravenous contrast was administered.  COMPARISON:  None Available.  FINDINGS: Segmentation:  Standard  Alignment: Levoscoliosis with apex at L3. Grade 1 retrolisthesis at L2-3.  Vertebrae:  No fracture, evidence of discitis, or bone lesion.  Conus medullaris and cauda equina: Conus extends to the T12-L1 disc level. Conus and cauda equina appear normal.  Paraspinal and other soft tissues: Negative  Disc levels:  L1-L2: Normal disc space and facet joints. No spinal canal stenosis. No neural foraminal stenosis.  L2-L3: Small disc bulge and mild right greater than left facet hypertrophy. Right lateral recess narrowing without central spinal canal stenosis. Left extraforaminal disc component contacts the exiting L2 nerve root. Mild bilateral neural foraminal stenosis.  L3-L4: Intermediate sized disc bulge with moderate facet arthrosis. Narrowing of both lateral recesses without central spinal canal stenosis. Moderate right neural foraminal stenosis.  L4-L5: Intermediate sized disc bulge mild facet arthrosis. Left lateral recess narrowing without central spinal canal stenosis. Mild right neural foraminal stenosis.  L5-S1: Intermediate sized left asymmetric disc bulge with extraforaminal component contacting the exiting L5 nerve root. Mild narrowing of both lateral recesses. No central spinal canal stenosis. Mild left neural foraminal stenosis.  Visualized sacrum: Normal.  IMPRESSION: 1.  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 2. Moderate right L3-4 neural foraminal stenosis. 3. Left extraforaminal disc component at L2-L3 contacts the exiting L2 nerve root. 4. Left extraforaminal disc bulge at L5-S1 contacts the exiting L5 nerve root. 5. Bilateral lateral recess narrowing at L3-4 and L4-5 and left lateral recess narrowing at L5-S1.   Electronically Signed By: Franky Stanford M.D. On: 05/06/2024 19:59  Lumbar MR wo contrast: No results found for this or any previous visit.  Lumbar CT wo contrast: No results found for this or any previous visit.  Lumbar DG Bending views: Results for orders placed during the hospital encounter of 03/29/24  DG Lumbar Spine Complete W/Bend  Narrative CLINICAL DATA:  Chronic low back pain.  EXAM: LUMBAR SPINE - COMPLETE WITH BENDING VIEWS  COMPARISON:  None Available.  FINDINGS: There is no evidence of lumbar spine fracture. Scoliosis. Multilevel mild narrow intervertebral spaces throughout the mid to lower lumbar spine. Mild to moderate facet joint sclerosis noted throughout the lumbar  spine. Anterior spurring noted at L3 and L4.  IMPRESSION: Degenerative joint changes of lumbar spine.   Electronically Signed By: Craig Farr M.D. On: 03/30/2024 12:06         Sacroiliac Joint Imaging: Sacroiliac Joint DG: No results found for this or any previous visit.   Hip Imaging: Hip-R MR wo contrast: No results found for this or any previous visit.  Hip-L MR wo contrast: No results found for this or any previous visit.  Hip-R CT wo contrast: No results found for this or any previous visit.  Hip-L CT wo contrast: No results found for this or any previous visit.  Hip-R DG 2-3 views: Results for orders placed during the hospital encounter of 03/29/24  DG HIP UNILAT W OR W/O PELVIS 2-3 VIEWS RIGHT  Narrative CLINICAL DATA:  Right hip pain.  EXAM: DG HIP (WITH OR WITHOUT PELVIS) 2-3V RIGHT  COMPARISON:  None  Available.  FINDINGS: There is no evidence of hip fracture or dislocation. There is no evidence of arthropathy or other focal bone abnormality.  IMPRESSION: Negative.   Electronically Signed By: Craig Farr M.D. On: 03/30/2024 12:07  Hip-L DG 2-3 views: No results found for this or any previous visit.  Hip-B DG Bilateral: No results found for this or any previous visit.  Hip-B DG Bilateral (5V): No results found for this or any previous visit.   Knee Imaging: Knee-R MR wo contrast: No results found for this or any previous visit.  Knee-L MR wo contrast: No results found for this or any previous visit.  Knee-R CT wo contrast: No results found for this or any previous visit.  Knee-L CT wo contrast: No results found for this or any previous visit.  Knee-R DG 4 views: No results found for this or any previous visit.  Knee-L DG 4 views: No results found for this or any previous visit.   Ankle Imaging: Ankle-R DG Complete: No results found for this or any previous visit.  Ankle-L DG Complete: No results found for this or any previous visit.   Foot Imaging: Foot-R DG Complete: No results found for this or any previous visit.  Foot-L DG Complete: No results found for this or any previous visit.   Elbow Imaging: Elbow-R DG Complete: No results found for this or any previous visit.  Elbow-L DG Complete: No results found for this or any previous visit.   Wrist Imaging: Wrist-R DG Complete: No results found for this or any previous visit.  Wrist-L DG Complete: No results found for this or any previous visit.   Hand Imaging: Hand-R DG Complete: No results found for this or any previous visit.  Hand-L DG Complete: No results found for this or any previous visit.   Complexity Note: Imaging results reviewed.                         Meds   Current Outpatient Medications:    Cholecalciferol 1.25 MG (50000 UT) capsule, Take by mouth., Disp: , Rfl:    gabapentin  (NEURONTIN) 300 MG capsule, Take 300 mg by mouth 3 (three) times daily., Disp: , Rfl:    levocetirizine (XYZAL) 5 MG tablet, Take 5 mg by mouth every evening., Disp: , Rfl:    meloxicam  (MOBIC ) 7.5 MG tablet, Take 1 tablet (7.5 mg total) by mouth daily., Disp: 30 tablet, Rfl: 0   montelukast (SINGULAIR) 10 MG tablet, Take by mouth., Disp: , Rfl:    Omega-3  Fatty Acids (FISH OIL) 300 MG CAPS, Take by mouth., Disp: , Rfl:   ROS  Constitutional: Denies any fever or chills Gastrointestinal: No reported hemesis, hematochezia, vomiting, or acute GI distress Musculoskeletal: Denies any acute onset joint swelling, redness, loss of ROM, or weakness Neurological: No reported episodes of acute onset apraxia, aphasia, dysarthria, agnosia, amnesia, paralysis, loss of coordination, or loss of consciousness  Allergies  Vickie [name] is allergic to shellfish allergy.  PFSH  Drug: Vickie [name]  reports no history of drug use. Alcohol:  reports current alcohol use. Tobacco:  reports that she quit smoking about 17 years ago. Her smoking use included cigarettes. She has never used smokeless tobacco. Medical:  has a past medical history of Allergy (2020), Arthritis, Asthma (childhood), BRCA negative (08/2021), Family history of breast cancer, Genetic testing of female, and Increased risk of breast cancer (08/2021). Surgical: Vickie [name]  has a past surgical history that includes Cesarean section; Ablation (Bilateral, 2007); Tubal ligation; Breast biopsy (Left); Breast biopsy (Left); Breast biopsy (Right, 07/13/2021); Breast biopsy (Right, 03/24/2022); and Colonoscopy with propofol  (N/A, 01/06/2024). Family: family history includes Asthma in her brother and sister; Breast cancer in her maternal grandmother, mother, sister, and another family member; Cancer in her brother, brother, mother, sister, and sister; Prostate cancer (age of onset: 68) in her brother; Prostate cancer (age of onset: 48) in her brother; Varicose  Veins in her mother.  Constitutional Exam  General appearance: Well nourished, well developed, and well hydrated. In no apparent acute distress Vitals:   05/16/24 1438  BP: 123/75  Pulse: 87  Temp: 98.4 F (36.9 C)  SpO2: 100%  Weight: 155 lb (70.3 kg)  Height: 5' 8 (1.727 m)   BMI Assessment: Estimated body mass index is 23.57 kg/m as calculated from the following:   Height as of this encounter: 5' 8 (1.727 m).   Weight as of this encounter: 155 lb (70.3 kg).  BMI interpretation table: BMI level Category Range association with higher incidence of chronic pain  <18 kg/m2 Underweight   18.5-24.9 kg/m2 Ideal body weight   25-29.9 kg/m2 Overweight Increased incidence by 20%  30-34.9 kg/m2 Obese (Class I) Increased incidence by 68%  35-39.9 kg/m2 Severe obesity (Class II) Increased incidence by 136%  >40 kg/m2 Extreme obesity (Class III) Increased incidence by 254%   Patient's current BMI Ideal Body weight  Body mass index is 23.57 kg/m. Ideal body weight: 63.9 kg (140 lb 14 oz) Adjusted ideal body weight: 66.5 kg (146 lb 8.4 oz)   BMI Readings from Last 4 Encounters:  05/16/24 23.57 kg/m  03/29/24 24.02 kg/m  03/08/24 24.33 kg/m  02/02/24 24.48 kg/m   Wt Readings from Last 4 Encounters:  05/16/24 155 lb (70.3 kg)  03/29/24 158 lb (71.7 kg)  03/08/24 160 lb (72.6 kg)  02/02/24 161 lb (73 kg)    Psych/Mental status: Alert, oriented x 3 (person, place, & time)       Eyes: PERLA Respiratory: No evidence of acute respiratory distress  Physical Exam   NECK: Neck is supple with full range of motion and no pain on movement.       Assessment & Plan  Primary Diagnosis & Pertinent Problem List: The primary encounter diagnosis was Chronic low back pain (1ry area of Pain) (Bilateral) w/o sciatica. Diagnoses of Chronic lower extremity pain (Left), Low back pain of over 3 months duration, Low back pain potentially associated with radiculopathy, Lumbar facet joint pain,  Multifactorial low back pain, Radicular pain of  lower extremity (Left), Chronic hip pain (Right), and Abnormal MRI, lumbar spine (05/06/2024) were also pertinent to this visit. Visit Diagnosis: 1. Chronic low back pain (1ry area of Pain) (Bilateral) w/o sciatica   2. Chronic lower extremity pain (Left)   3. Low back pain of over 3 months duration   4. Low back pain potentially associated with radiculopathy   5. Lumbar facet joint pain   6. Multifactorial low back pain   7. Radicular pain of lower extremity (Left)   8. Chronic hip pain (Right)   9. 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 Grade 1 retrolisthesis at L2-3. Vertebrae:  No fracture, evidence of discitis, or bone lesion. Conus medullaris and cauda equina: Conus extends to the T12-L1 disc level. Conus and cauda equina appear normal. Paraspinal and other soft tissues: Negative  DISC LEVELS: L1-2: Normal disc space and facet joints. No spinal canal stenosis. No neural foraminal stenosis. L2-3: Small disc bulge and mild right greater than left facet hypertrophy. Right lateral recess narrowing without central spinal canal stenosis. Left extraforaminal disc component contacts the exiting L2 nerve root. Mild bilateral neural foraminal stenosis. L3-4: Intermediate sized disc bulge with moderate facet arthrosis. Narrowing of both lateral recesses without central spinal canal stenosis. Moderate right neural foraminal stenosis. L4-5: Intermediate sized disc bulge mild facet arthrosis. Left lateral recess narrowing without central spinal canal stenosis. Mild right neural foraminal stenosis. L5-S1: Intermediate sized left asymmetric disc bulge with extraforaminal component contacting the exiting L5 nerve root. Mild narrowing of both  lateral recesses. No central spinal canal stenosis. Mild left neural foraminal stenosis. Visualized sacrum: Normal.  IMPRESSION: 1. 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 2. Moderate right L3-4 neural foraminal stenosis. 3. Left extraforaminal disc component at L2-L3 contacts the exiting L2 nerve root. 4. Left extraforaminal disc bulge at L5-S1 contacts the exiting L5 nerve root. 5. Bilateral lateral recess narrowing at L3-4 and L4-5 and left lateral recess narrowing at L5-S1.   Chronic upper back pain (between shoulder blades)    Plan of Care  Assessment and Plan    Left L2-3 disc bulge with nerve root irritation   A disc bulge at L2-3 with a left extra foraminal component is contacting the L2 nerve root, causing left leg pain. Oral steroids did not alleviate symptoms. Schedule a left-sided L2-3 lumbar epidural steroid injection.  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  Facet joint arthritis of lumbar spine   Arthritis in the lumbar spine's facet joints is causing back pain, with potential referred pain to the buttocks and legs. Advise weight management to maintain a BMI under 30 and avoid heavy lifting to prevent further joint hypertrophy.  Degenerative disc disease of lumbar spine   Degenerative changes in the lumbar spine show decreased space between vertebral bodies, indicating disc degeneration. Mild to moderate facet joint sclerosis is noted, with no spinal instability observed on flexion and extension x-rays.  Retrolisthesis of L2 over L3   Grade one retrolisthesis of L2 over L3 is present, with no movement observed on flexion and extension x-rays. No surgical intervention is required at this time.  Scoliosis of lumbar spine   Mild scoliosis with curvature towards the left side at the  L3 level is noted, with no significant impact on spinal  stability.  Upper back pain with right arm weakness   New onset upper back pain radiates to the right arm, possibly muscular in origin, with symptoms lasting 8 days to 2 weeks. Recommend exercises, ice, and heat application. Suggest over-the-counter medications like Tylenol or ibuprofen as needed.      Pharmacotherapy (Medications Ordered): No orders of the defined types were placed in this encounter.  Procedure Orders         Lumbar Epidural Injection     Orders Placed This Encounter  Procedures   Lumbar Epidural Injection    Standing Status:   Future    Expiration Date:   08/16/2024    Scheduling Instructions:     Procedure: Interlaminar Lumbar Epidural Steroid injection (LESI)  L2-3     Laterality: Left-sided     Sedation: None required.     Timeframe: As soon as schedule allows.    Where will this procedure be performed?:   [facility]   Lab Orders  No laboratory test(s) ordered today   Imaging Orders  No imaging studies ordered today   Referral Orders  No referral(s) requested today    Pharmacological management:  Opioid Analgesics: I will not be prescribing any opioids at this time Membrane stabilizer: I will not be prescribing any at this time Muscle relaxant: I will not be prescribing any at this time NSAID: I will not be prescribing any at this time Other analgesic(s): I will not be prescribing any at this time      Interventional Therapies  Risk Factors  Considerations  Medical Comorbidities:     Planned  Pending:   Diagnostic/therapeutic left L2-3 LESI #1    Under consideration:   Pending   Completed:   None at this time   Therapeutic  Palliative (PRN) options:   None established   Completed by other providers:   Diagnostic left L4-5 & L5-S1 TFESI x1 (07/12/2022) by Rockey JONELLE Pae, MD ([facility])  Diagnostic bilateral L4-5 & L5-S1 Facet MBB x2 (08/30/2022, 10/18/2022) by Rockey JONELLE Pae, MD ([facility])        Provider-requested follow-up: Return for [facility]): (L) L2-3 LESI #1. Recent Visits Date Type Provider Dept  03/29/24 Office Visit Tanya Glisson, MD [facility]  Showing recent visits within past 90 days and meeting all other requirements Today's Visits Date Type Provider Dept  05/16/24 Office Visit Tanya Glisson, MD [facility]  Showing today's visits and meeting all other requirements Future Appointments No visits were found meeting these conditions. Showing future appointments within next 90 days and meeting all other requirements   Primary Care Physician: Vincente Saber, NP  Duration of encounter: 71 minutes.  Total time on encounter, as per AMA guidelines included both the face-to-face and non-face-to-face time personally spent by the physician and/or other qualified health care professional(s) on the day of the encounter (includes time in activities that require the physician or other qualified health care professional and does not include time in activities normally performed by clinical staff). Physician's time may include the following activities when performed: Preparing to see the patient (e.g., pre-charting review of records, searching for previously ordered imaging, lab work, and nerve conduction tests) Review of prior analgesic pharmacotherapies. Reviewing PMP Interpreting ordered tests (e.g., lab work, imaging, nerve conduction tests) Performing post-procedure evaluations, including interpretation of diagnostic procedures Obtaining and/or reviewing separately obtained history Performing a medically appropriate examination and/or  evaluation Counseling and educating the patient/family/caregiver Ordering medications, tests, or procedures Referring and communicating with other health care professionals (when not separately reported) Documenting clinical information in the electronic or other health record Independently  interpreting results (not separately reported) and communicating results to the patient/ family/caregiver Care coordination (not separately reported)  Note by: Eric DELENA Como, MD (TTS technology used. I apologize for any typographical errors that were not detected and corrected.) Date: 05/16/2024; Time: 6:05 PM

## 2024-05-16 NOTE — Progress Notes (Signed)
 Safety precautions to be maintained throughout the outpatient stay will include: orient to surroundings, keep bed in low position, maintain call bell within reach at all times, provide assistance with transfer out of bed and ambulation.

## 2024-05-29 ENCOUNTER — Encounter: Payer: Self-pay | Admitting: Pain Medicine

## 2024-05-29 ENCOUNTER — Ambulatory Visit
Admission: RE | Admit: 2024-05-29 | Discharge: 2024-05-29 | Disposition: A | Discharge status: Home / Self Care | Source: Ambulatory Visit | Attending: Pain Medicine | Admitting: Pain Medicine

## 2024-05-29 ENCOUNTER — Ambulatory Visit (HOSPITAL_BASED_OUTPATIENT_CLINIC_OR_DEPARTMENT_OTHER): Admitting: Pain Medicine

## 2024-05-29 VITALS — BP 141/76 | HR 73 | Temp 98.4°F | Resp 16 | Ht 68.0 in | Wt 155.0 lb

## 2024-05-29 DIAGNOSIS — G8929 Other chronic pain: Secondary | ICD-10-CM | POA: Insufficient documentation

## 2024-05-29 DIAGNOSIS — M545 Low back pain, unspecified: Secondary | ICD-10-CM | POA: Insufficient documentation

## 2024-05-29 DIAGNOSIS — M5459 Other low back pain: Secondary | ICD-10-CM | POA: Insufficient documentation

## 2024-05-29 DIAGNOSIS — M79605 Pain in left leg: Secondary | ICD-10-CM | POA: Diagnosis present

## 2024-05-29 DIAGNOSIS — R937 Abnormal findings on diagnostic imaging of other parts of musculoskeletal system: Secondary | ICD-10-CM | POA: Insufficient documentation

## 2024-05-29 DIAGNOSIS — M5410 Radiculopathy, site unspecified: Secondary | ICD-10-CM | POA: Diagnosis present

## 2024-05-29 DIAGNOSIS — M25551 Pain in right hip: Secondary | ICD-10-CM | POA: Diagnosis present

## 2024-05-29 MED ORDER — PENTAFLUOROPROP-TETRAFLUOROETH EX AERO: INHALATION_SPRAY | Freq: Once | CUTANEOUS | Status: DC

## 2024-05-29 MED ORDER — SODIUM CHLORIDE 0.9% FLUSH
2.0000 mL | Freq: Once | INTRAVENOUS | Status: AC
  Administered 2024-05-29: 2 mL

## 2024-05-29 MED ORDER — MIDAZOLAM HCL 2 MG/2ML IJ SOLN: 0.5000 mg | Freq: Once | INTRAMUSCULAR | Status: DC

## 2024-05-29 MED ORDER — LIDOCAINE HCL 2 % IJ SOLN
20.0000 mL | Freq: Once | INTRAMUSCULAR | Status: AC
  Administered 2024-05-29: 100 mg
  Filled 2024-05-29: qty 40

## 2024-05-29 MED ORDER — TRIAMCINOLONE ACETONIDE 40 MG/ML IJ SUSP
40.0000 mg | Freq: Once | INTRAMUSCULAR | Status: AC
  Administered 2024-05-29: 40 mg
  Filled 2024-05-29: qty 1

## 2024-05-29 MED ORDER — ROPIVACAINE HCL 2 MG/ML IJ SOLN
2.0000 mL | Freq: Once | INTRAMUSCULAR | Status: AC
  Administered 2024-05-29: 2 mL via EPIDURAL
  Filled 2024-05-29: qty 20

## 2024-05-29 MED ORDER — IOHEXOL 180 MG/ML  SOLN: 10.0000 mL | Freq: Once | INTRAMUSCULAR | Status: DC

## 2024-05-29 NOTE — Progress Notes (Signed)
 Safety precautions to be maintained throughout the outpatient stay will include: orient to surroundings, keep bed in low position, maintain call bell within reach at all times, provide assistance with transfer out of bed and ambulation.

## 2024-05-29 NOTE — Patient Instructions (Addendum)

## 2024-05-29 NOTE — Progress Notes (Signed)
 PROVIDER NOTE: Interpretation of information contained herein should be left to medically-trained personnel. Specific patient instructions are provided elsewhere under Patient Instructions section of medical record. This document was created in part using STT-dictation technology, any transcriptional errors that may result from this process are unintentional.  Patient: Vickie [name] Type: Established DOB: [birth year] MRN: [id number] PCP: Vincente Saber, NP  Service: Procedure DOS: 05/29/2024 Setting: Ambulatory Location: Ambulatory outpatient facility Delivery: Face-to-face Provider: Eric DELENA Como, MD Specialty: Interventional Pain Management Specialty designation: 09 Location: Outpatient facility Ref. Prov.: Como Eric, MD       Interventional Therapy   Type: Lumbar epidural steroid injection (LESI) (interlaminar) #1    Laterality: Left   Level:  L2-3 Level.  Imaging: Fluoroscopic guidance Spinal ([id number]) Anesthesia: Local anesthesia (1-2% Lidocaine ) Anxiolysis: None                 Sedation: No Sedation                       DOS: 05/29/2024  Performed by: Eric DELENA Como, MD  Purpose: Diagnostic/Therapeutic Indications: Lumbar radicular pain of intraspinal etiology of more than 4 weeks that has failed to respond to conservative therapy and is severe enough to impact quality of life or function. 1. Chronic low back pain (1ry area of Pain) (Bilateral) w/o sciatica   2. Low back pain of over 3 months duration   3. Low back pain potentially associated with radiculopathy   4. Multifactorial low back pain   5. Radicular pain of lower extremity (Left)   6. Abnormal MRI, lumbar spine (05/06/2024)   7. Chronic lower extremity pain (Left)   8. Lumbar facet joint pain   9. Chronic hip pain (Right)    NAS-11 Pain score:   Pre-procedure: 5 /10   Post-procedure: 5 /10      Position / Prep / Materials:  Position: Prone w/ head of the table raised (slight reverse  trendelenburg) to facilitate breathing.  Prep solution: ChloraPrep (2% chlorhexidine gluconate and 70% isopropyl alcohol) Prep Area: Entire Posterior Lumbar Region from lower scapular tip down to mid buttocks area and from flank to flank. Materials:  Tray: Epidural tray Needle(s):  Type: Epidural needle (Tuohy) Gauge (G):  17 Length: Regular (3.5-in) Qty: 1  H&P (Pre-op Assessment):  Mrs. Eichholz is a 58 y.o. (year old), female patient, seen today for interventional treatment. She  has a past surgical history that includes Cesarean section; Ablation (Bilateral, 2007); Tubal ligation; Breast biopsy (Left); Breast biopsy (Left); Breast biopsy (Right, 07/13/2021); Breast biopsy (Right, 03/24/2022); and Colonoscopy with propofol  (N/A, 01/06/2024). Mrs. Randon has a current medication list which includes the following prescription(s): cholecalciferol, gabapentin, levocetirizine, meloxicam , montelukast, and fish oil, and the following Facility-Administered Medications: pentafluoroprop-tetrafluoroeth. Her primarily concern today is the Back Pain (Lower, left)  Initial Vital Signs:  Pulse/HCG Rate: 73ECG Heart Rate: 75 Temp: 98.4 F (36.9 C) Resp: 18 BP: 113/63 SpO2: 100 %  BMI: Estimated body mass index is 23.57 kg/m as calculated from the following:   Height as of this encounter: 5' 8 (1.727 m).   Weight as of this encounter: 155 lb (70.3 kg).  Risk Assessment: Allergies: Reviewed. She is allergic to shellfish allergy.  Allergy Precautions: None required Coagulopathies: Reviewed. None identified.  Blood-thinner therapy: None at this time Active Infection(s): Reviewed. None identified. Mrs. Michelotti is afebrile  Site Confirmation: Mrs. Pascual was asked to confirm the procedure and laterality before marking the site Procedure checklist: Completed Consent: Before  the procedure and under the influence of no sedative(s), amnesic(s), or anxiolytics, the patient was informed of the treatment  options, risks and possible complications. To fulfill our ethical and legal obligations, as recommended by the American Medical Association's Code of Ethics, I have informed the patient of my clinical impression; the nature and purpose of the treatment or procedure; the risks, benefits, and possible complications of the intervention; the alternatives, including doing nothing; the risk(s) and benefit(s) of the alternative treatment(s) or procedure(s); and the risk(s) and benefit(s) of doing nothing. The patient was provided information about the general risks and possible complications associated with the procedure. These may include, but are not limited to: failure to achieve desired goals, infection, bleeding, organ or nerve damage, allergic reactions, paralysis, and death. In addition, the patient was informed of those risks and complications associated to Spine-related procedures, such as failure to decrease pain; infection (i.e.: Meningitis, epidural or intraspinal abscess); bleeding (i.e.: epidural hematoma, subarachnoid hemorrhage, or any other type of intraspinal or peri-dural bleeding); organ or nerve damage (i.e.: Any type of peripheral nerve, nerve root, or spinal cord injury) with subsequent damage to sensory, motor, and/or autonomic systems, resulting in permanent pain, numbness, and/or weakness of one or several areas of the body; allergic reactions; (i.e.: anaphylactic reaction); and/or death. Furthermore, the patient was informed of those risks and complications associated with the medications. These include, but are not limited to: allergic reactions (i.e.: anaphylactic or anaphylactoid reaction(s)); adrenal axis suppression; blood sugar elevation that in diabetics may result in ketoacidosis or comma; water  retention that in patients with history of congestive heart failure may result in shortness of breath, pulmonary edema, and decompensation with resultant heart failure; weight gain; swelling or  edema; medication-induced neural toxicity; particulate matter embolism and blood vessel occlusion with resultant organ, and/or nervous system infarction; and/or aseptic necrosis of one or more joints. Finally, the patient was informed that Medicine is not an exact science; therefore, there is also the possibility of unforeseen or unpredictable risks and/or possible complications that may result in a catastrophic outcome. The patient indicated having understood very clearly. We have given the patient no guarantees and we have made no promises. Enough time was given to the patient to ask questions, all of which were answered to the patient's satisfaction. Mrs. Dietz has indicated that she wanted to continue with the procedure. Attestation: I, the ordering provider, attest that I have discussed with the patient the benefits, risks, side-effects, alternatives, likelihood of achieving goals, and potential problems during recovery for the procedure that I have provided informed consent. Date  Time: 05/29/2024  1:03 PM  Pre-Procedure Preparation:  Monitoring: As per clinic protocol. Respiration, ETCO2, SpO2, BP, heart rate and rhythm monitor placed and checked for adequate function Safety Precautions: Patient was assessed for positional comfort and pressure points before starting the procedure. Time-out: I initiated and conducted the Time-out before starting the procedure, as per protocol. The patient was asked to participate by confirming the accuracy of the Time Out information. Verification of the correct person, site, and procedure were performed and confirmed by me, the nursing staff, and the patient. Time-out conducted as per Joint Commission's Universal Protocol (UP.01.01.01). Time: 1334 Start Time: 1334 hrs.  Description/Narrative of Procedure:          Target: Epidural space via interlaminar opening, initially targeting the lower laminar border of the superior vertebral body. Region:  Lumbar Approach: Percutaneous paravertebral  Rationale (medical necessity): procedure needed and proper for the diagnosis and/or treatment  of the patient's medical symptoms and needs. Procedural Technique Safety Precautions: Aspiration looking for blood return was conducted prior to all injections. At no point did we inject any substances, as a needle was being advanced. No attempts were made at seeking any paresthesias. Safe injection practices and needle disposal techniques used. Medications properly checked for expiration dates. SDV (single dose vial) medications used. Description of the Procedure: Protocol guidelines were followed. The procedure needle was introduced through the skin, ipsilateral to the reported pain, and advanced to the target area. Bone was contacted and the needle walked caudad, until the lamina was cleared. The epidural space was identified using "loss-of-resistance technique" with 2-3 ml of PF-NaCl (0.9% NSS), in a 5cc LOR glass syringe.  Vitals:   05/29/24 1301 05/29/24 1332 05/29/24 1338  BP: 113/63 123/65 (!) 141/76  Pulse: 73    Resp:  18 16  Temp: 98.4 F (36.9 C)    SpO2: 100% 100% 100%  Weight: 155 lb (70.3 kg)    Height: 5' 8 (1.727 m)      Start Time: 1334 hrs. End Time: 1341 hrs.  Imaging Guidance (Spinal):          Type of Imaging Technique: Fluoroscopy Guidance (Spinal) Indication(s): Fluoroscopy guidance for needle placement to enhance accuracy in procedures requiring precise needle localization for targeted delivery of medication in or near specific anatomical locations not easily accessible without such real-time imaging assistance. Exposure Time: Please see nurses notes. Contrast: Before injecting any contrast, we confirmed that the patient did not have an allergy to iodine, shellfish, or radiological contrast. Once satisfactory needle placement was completed at the desired level, radiological contrast was injected. Contrast injected under live  fluoroscopy. No contrast complications. See chart for type and volume of contrast used. Fluoroscopic Guidance: I was personally present during the use of fluoroscopy. Tunnel Vision Technique used to obtain the best possible view of the target area. Parallax error corrected before commencing the procedure. Direction-depth-direction technique used to introduce the needle under continuous pulsed fluoroscopy. Once target was reached, antero-posterior, oblique, and lateral fluoroscopic projection used confirm needle placement in all planes. Images permanently stored in EMR. Interpretation: I personally interpreted the imaging intraoperatively. Adequate needle placement confirmed in multiple planes. Appropriate spread of contrast into desired area was observed. No evidence of afferent or efferent intravascular uptake. No intrathecal or subarachnoid spread observed. Permanent images saved into the patient's record.  Antibiotic Prophylaxis:   Anti-infectives (From admission, onward)    None      Indication(s): None identified  Post-operative Assessment:  Post-procedure Vital Signs:  Pulse/HCG Rate: 7375 Temp: 98.4 F (36.9 C) Resp: 16 BP: (!) 141/76 SpO2: 100 %  EBL: None  Complications: No immediate post-treatment complications observed by team, or reported by patient.  Note: The patient tolerated the entire procedure well. A repeat set of vitals were taken after the procedure and the patient was kept under observation following institutional policy, for this type of procedure. Post-procedural neurological assessment was performed, showing return to baseline, prior to discharge. The patient was provided with post-procedure discharge instructions, including a section on how to identify potential problems. Should any problems arise concerning this procedure, the patient was given instructions to immediately contact us , at any time, without hesitation. In any case, we plan to contact the patient  by telephone for a follow-up status report regarding this interventional procedure.  Comments:  No additional relevant information.  Plan of Care (POC)  Orders:  Orders Placed This Encounter  Procedures  Lumbar Epidural Injection    Scheduling Instructions:     Procedure: Interlaminar LESI L2-3     Laterality: Left     Sedation: Patient's choice     Date: 05/29/2024    Where will this procedure be performed?:   [facility]   [facility] C-ARM 1-60 MIN NO REPORT    Intraoperative interpretation by procedural physician at [facility].    Standing Status:   Standing    Number of Occurrences:   1    Reason for exam::   Assistance in needle guidance and placement for procedures requiring needle placement in or near specific anatomical locations not easily accessible without such assistance.   Informed Consent Details: Physician/Practitioner Attestation; Transcribe to consent form and obtain patient signature    Note: Always confirm laterality of pain with Mrs. Scalese, before procedure. Transcribe to consent form and obtain patient signature.    Physician/Practitioner attestation of informed consent for procedure/surgical case:   I, the physician/practitioner, attest that I have discussed with the patient the benefits, risks, side effects, alternatives, likelihood of achieving goals and potential problems during recovery for the procedure that I have provided informed consent.    Procedure:   Lumbar epidural steroid injection under fluoroscopic guidance    Physician/Practitioner performing the procedure:   Peyten Weare A. Tangy Drozdowski, MD    Indication/Reason:   Low back and/or lower extremity pain secondary to lumbar radiculitis   Provide equipment / supplies at bedside    Procedural tray: Epidural Tray (Disposable  single use) Skin infiltration needle: Regular 1.5-in, 25-G, (x1) Block needle size: Regular standard Catheter: No catheter required    Standing Status:    Standing    Number of Occurrences:   1    Specify:   Epidural Tray     None MME/day: 0 mg/day    Medications ordered for procedure: Meds ordered this encounter  Medications   DISCONTD: iohexol  (OMNIPAQUE ) 180 MG/ML injection 10 mL    Must be Myelogram-compatible. If not available, you may substitute with a water -soluble, non-ionic, hypoallergenic, myelogram-compatible radiological contrast medium.   lidocaine  (XYLOCAINE ) 2 % (with pres) injection 400 mg   pentafluoroprop-tetrafluoroeth (GEBAUERS) aerosol   DISCONTD: midazolam  (VERSED ) injection 0.5-2 mg    Make sure Flumazenil is available in the pyxis when using this medication. If oversedation occurs, administer 0.2 mg IV over 15 sec. If after 45 sec no response, administer 0.2 mg again over 1 min; may repeat at 1 min intervals; not to exceed 4 doses (1 mg)   sodium chloride  flush (NS) 0.9 % injection 2 mL   ropivacaine  (PF) 2 mg/mL (0.2%) (NAROPIN ) injection 2 mL   triamcinolone  acetonide (KENALOG -40) injection 40 mg   Medications administered: We administered lidocaine , sodium chloride  flush, ropivacaine  (PF) 2 mg/mL (0.2%), and triamcinolone  acetonide.  See the medical record for exact dosing, route, and time of administration.    Interventional Therapies  Risk Factors  Considerations  Medical Comorbidities:     Planned  Pending:   Diagnostic/therapeutic left L2-3 LESI #1    Under consideration:   Pending   Completed:   None at this time   Therapeutic  Palliative (PRN) options:   None established   Completed by other providers:   Diagnostic left L4-5 & L5-S1 TFESI x1 (07/12/2022) by Rockey JONELLE Pae, MD ([facility])  Diagnostic bilateral L4-5 & L5-S1 Facet MBB x2 (08/30/2022, 10/18/2022) by Rockey JONELLE Pae, MD ([facility])  Follow-up plan:   Return in about 2 weeks (around 06/12/2024) for (Face2F), (PPE).     Recent Visits Date Type Provider Dept  05/16/24 Office  Visit Tanya Glisson, MD [facility]  03/29/24 Office Visit Tanya Glisson, MD [facility]  Showing recent visits within past 90 days and meeting all other requirements Today's Visits Date Type Provider Dept  05/29/24 Procedure visit Tanya Glisson, MD [facility]  Showing today's visits and meeting all other requirements Future Appointments Date Type Provider Dept  06/19/24 Appointment Tanya Glisson, MD [facility]  Showing future appointments within next 90 days and meeting all other requirements   Disposition: Discharge home  Discharge (Date  Time): 05/29/2024; 1350 hrs.   Primary Care Physician: Vincente Saber, NP Location: [facility] Note by: Glisson DELENA Tanya, MD (TTS technology used. I apologize for any typographical errors that were not detected and corrected.) Date: 05/29/2024; Time: 1:57 PM  Disclaimer:  Medicine is not an Visual merchandiser. The only guarantee in medicine is that nothing is guaranteed. It is important to note that the decision to proceed with this intervention was based on the information collected from the patient. The Data and conclusions were drawn from the patient's questionnaire, the interview, and the physical examination. Because the information was provided in large part by the patient, it cannot be guaranteed that it has not been purposely or unconsciously manipulated. Every effort has been made to obtain as much relevant data as possible for this evaluation. It is important to note that the conclusions that lead to this procedure are derived in large part from the available data. Always take into account that the treatment will also be dependent on availability of resources and existing treatment guidelines, considered by other Pain Management Practitioners as being common knowledge and practice, at the time of the intervention. For Medico-Legal purposes, it is also  important to point out that variation in procedural techniques and pharmacological choices are the acceptable norm. The indications, contraindications, technique, and results of the above procedure should only be interpreted and judged by a Board-Certified Interventional Pain Specialist with extensive familiarity and expertise in the same exact procedure and technique.

## 2024-05-30 ENCOUNTER — Telehealth: Payer: Self-pay | Admitting: *Deleted

## 2024-05-30 NOTE — Telephone Encounter (Signed)
 No problems post procedure.

## 2024-06-19 ENCOUNTER — Ambulatory Visit: Attending: Pain Medicine | Admitting: Pain Medicine

## 2024-06-19 ENCOUNTER — Encounter: Payer: Self-pay | Admitting: Pain Medicine

## 2024-06-19 VITALS — BP 125/56 | HR 75 | Temp 97.7°F | Resp 16 | Ht 68.0 in | Wt 156.0 lb

## 2024-06-19 DIAGNOSIS — Z09 Encounter for follow-up examination after completed treatment for conditions other than malignant neoplasm: Secondary | ICD-10-CM | POA: Insufficient documentation

## 2024-06-19 DIAGNOSIS — G4762 Sleep related leg cramps: Secondary | ICD-10-CM | POA: Diagnosis not present

## 2024-06-19 DIAGNOSIS — G8929 Other chronic pain: Secondary | ICD-10-CM | POA: Diagnosis present

## 2024-06-19 DIAGNOSIS — M545 Low back pain, unspecified: Secondary | ICD-10-CM | POA: Diagnosis present

## 2024-06-19 NOTE — Progress Notes (Signed)
 PROVIDER NOTE: Interpretation of information contained herein should be left to medically-trained personnel. Specific patient instructions are provided elsewhere under Patient Instructions section of medical record. This document was created in part using AI and STT-dictation technology, any transcriptional errors that may result from this process are unintentional.  Patient: Vickie [name]  Service: E/M   PCP: Vincente Saber, NP  DOB: [birth year]  DOS: 06/19/2024  Provider: Eric DELENA Como, MD  MRN: [id number]  Delivery: Face-to-face  Specialty: Interventional Pain Management  Type: Established Patient  Setting: Ambulatory outpatient facility  Specialty designation: 09  Referring Prov.: Vincente Saber, NP  Location: Outpatient office facility       History of present illness (HPI) Vickie [name], a 58 y.o. year old female, is here today because of her Chronic bilateral low back pain without sciatica [M54.50, G89.29]. Vickie [name] primary complain today is Back Pain  Pertinent problems: Vickie [name] has Varicose veins with pain; Chronic low back pain (1ry area of Pain) (Bilateral) w/o sciatica; Chronic lower extremity pain (Left); Levoscoliosis; Lumbar radiculopathy; Spondylosis without myelopathy or radiculopathy, lumbar region; Paresthesia of hand, bilateral; Chronic pain syndrome; Chronic hip pain (Right); Radicular pain of lower extremity (Left); Low back pain of over 3 months duration; Mechanical low back pain; Multifactorial low back pain; Low back pain potentially associated with radiculopathy; Lumbar facet joint pain; Lumbar facet joint syndrome; Chronic upper back pain (between shoulder blades); Abnormal MRI, lumbar spine (05/06/2024); and Nocturnal leg cramps on their pertinent problem list.  Pain Assessment: Severity of Chronic pain is reported as a 3 /10. Location: Back Lower/radiates down left leg to ankle on the iinside. Onset: More than a month ago. Quality: Aching, Sharp,  Cramping. Timing: Intermittent. Modifying factor(s): proc. Vitals:  height is 5' 8 (1.727 m) and weight is 156 lb (70.8 kg). Her temperature is 97.7 F (36.5 C). Her blood pressure is 125/56 (abnormal) and her pulse is 75. Her respiration is 16 and oxygen saturation is 100%.  BMI: Estimated body mass index is 23.72 kg/m as calculated from the following:   Height as of this encounter: 5' 8 (1.727 m).   Weight as of this encounter: 156 lb (70.8 kg).  Last encounter: 05/16/2024. Last procedure: 05/29/2024.  Reason for encounter: post-procedure evaluation and assessment.   Discussed the use of AI scribe software for clinical note transcription with the patient, who gave verbal consent to proceed.  History of Present Illness   Vickie [name] is a 58 year old female who presents for follow-up after a left-sided L2-3 lumbar vertebral steroid injection.  She experienced 100% relief of pain during the duration of the local anesthetic and continues to have ongoing 75% relief following the procedure.  Prior to the injection, she had back pain with discomfort radiating into the right hip and left leg. Post-procedure, the leg pain has become minor and is primarily present in the evening, whereas the hip pain persists as an aching sensation.  The leg pain has changed from being constant to more intermittent, and the hip pain remains bothersome.  She mentions experiencing 'real bad cramps' but does not specify the location or frequency of these cramps.      Post-Procedure Evaluation   Type: Lumbar epidural steroid injection (LESI) (interlaminar) #1    Laterality: Left   Level:  L2-3 Level.  Imaging: Fluoroscopic guidance Spinal ([id number]) Anesthesia: Local anesthesia (1-2% Lidocaine ) Anxiolysis: None                 Sedation: No  Sedation                       DOS: 05/29/2024  Performed by: Eric DELENA Como, MD  Purpose: Diagnostic/Therapeutic Indications: Lumbar radicular pain of  intraspinal etiology of more than 4 weeks that has failed to respond to conservative therapy and is severe enough to impact quality of life or function. 1. Chronic low back pain (1ry area of Pain) (Bilateral) w/o sciatica   2. Low back pain of over 3 months duration   3. Low back pain potentially associated with radiculopathy   4. Multifactorial low back pain   5. Radicular pain of lower extremity (Left)   6. Abnormal MRI, lumbar spine (05/06/2024)   7. Chronic lower extremity pain (Left)   8. Lumbar facet joint pain   9. Chronic hip pain (Right)    NAS-11 Pain score:   Pre-procedure: 5 /10   Post-procedure: 5 /10     Effectiveness:  Initial hour after procedure: 100 %. Subsequent 4-6 hours post-procedure: 75 %. Analgesia past initial 6 hours: 75 % (ongo9ng). Ongoing improvement:  Analgesic: The patient indicates having attained 100% relief of the pain for the duration of the local anesthetic followed by an ongoing 75% improvement.  She refers that the pain in the left lower extremity is no longer constant and she only has the pain towards the end of the day.  In the case of the right hip pain this has also improved, but she still feels that it is present.  However, she states that for the time being she feels okay and does not need anything else done. Function: Vickie [name] reports improvement in function ROM: Vickie [name] reports improvement in ROM  Pharmacotherapy Assessment   Opioid analgesic: No chronic opioid analgesics therapy prescribed by our practice. None MME/day: 0 mg/day   Monitoring: [place] PMP: PDMP reviewed during this encounter.       Pharmacotherapy: No side-effects or adverse reactions reported. Compliance: No problems identified. Effectiveness: Clinically acceptable.  Rebecka Wolm HERO, RN  06/19/2024  1:20 PM  Sign when Signing Visit Safety precautions to be maintained throughout the outpatient stay will include: orient to surroundings, keep bed in low position, maintain  call bell within reach at all times, provide assistance with transfer out of bed and ambulation.    UDS:  Summary  Date Value Ref Range Status  03/29/2024 FINAL  Final    Comment:    ==================================================================== Compliance Drug Analysis, Ur ==================================================================== Specimen Alert Note: Urinary creatinine is low; ability to detect some drugs may be compromised. Interpret results with caution. (Creatinine) ==================================================================== Test                             Result       Flag       Units  Drug Absent but Declared for Prescription Verification   Gabapentin                     Not Detected UNEXPECTED ==================================================================== Test                      Result    Flag   Units      Ref Range   Creatinine              19        LL     mg/dL      >=  20 ==================================================================== Declared Medications:  The flagging and interpretation on this report are based on the  following declared medications.  Unexpected results may arise from  inaccuracies in the declared medications.   **Note: The testing scope of this panel includes these medications:   Gabapentin (Neurontin)   **Note: The testing scope of this panel does not include the  following reported medications:   Cholecalciferol  Fish Oil  Levocetirizine (Xyzal)  Meloxicam  (Mobic )  Montelukast (Singulair)  Prednisone  (Deltasone ) ==================================================================== For clinical consultation, please call [phone]. ====================================================================     No results found for: CBDTHCR No results found for: D8THCCBX No results found for: D9THCCBX  ROS  Constitutional: Denies any fever or chills Gastrointestinal: No reported hemesis,  hematochezia, vomiting, or acute GI distress Musculoskeletal: Denies any acute onset joint swelling, redness, loss of ROM, or weakness Neurological: No reported episodes of acute onset apraxia, aphasia, dysarthria, agnosia, amnesia, paralysis, loss of coordination, or loss of consciousness  Medication Review  Cholecalciferol, Fish Oil, gabapentin, levocetirizine, meloxicam , and montelukast  History Review  Allergy: Vickie [name] is allergic to shellfish allergy. Drug: Vickie [name]  reports no history of drug use. Alcohol:  reports current alcohol use. Tobacco:  reports that she quit smoking about 17 years ago. Her smoking use included cigarettes. She has never used smokeless tobacco. Social: Vickie [name]  reports that she quit smoking about 17 years ago. Her smoking use included cigarettes. She has never used smokeless tobacco. She reports current alcohol use. She reports that she does not use drugs. Medical:  has a past medical history of Allergy (2020), Arthritis, Asthma (childhood), BRCA negative (08/2021), Family history of breast cancer, Genetic testing of female, and Increased risk of breast cancer (08/2021). Surgical: Vickie [name]  has a past surgical history that includes Cesarean section; Ablation (Bilateral, 2007); Tubal ligation; Breast biopsy (Left); Breast biopsy (Left); Breast biopsy (Right, 07/13/2021); Breast biopsy (Right, 03/24/2022); and Colonoscopy with propofol  (N/A, 01/06/2024). Family: family history includes Asthma in her brother and sister; Breast cancer in her maternal grandmother, mother, sister, and another family member; Cancer in her brother, brother, mother, sister, and sister; Prostate cancer (age of onset: 47) in her brother; Prostate cancer (age of onset: 18) in her brother; Varicose Veins in her mother.  Laboratory Chemistry Profile   Renal Lab Results  Component Value Date   BUN 12 03/29/2024   CREATININE 0.74 03/29/2024   BCR 16 03/29/2024   GFR 95.54  12/09/2023   GFRAA 106 10/21/2020   GFRNONAA 92 10/21/2020    Hepatic Lab Results  Component Value Date   AST 28 03/29/2024   ALT 15 12/09/2023   ALBUMIN 4.5 03/29/2024   ALKPHOS 76 03/29/2024    Electrolytes Lab Results  Component Value Date   NA 139 03/29/2024   K 4.3 03/29/2024   CL 98 03/29/2024   CALCIUM 9.7 03/29/2024   MG 2.1 03/29/2024    Bone Lab Results  Component Value Date   VD25OH 51.83 12/09/2023   25OHVITD1 67 03/29/2024   25OHVITD2 <1.0 03/29/2024   25OHVITD3 67 03/29/2024    Inflammation (CRP: Acute Phase) (ESR: Chronic Phase) Lab Results  Component Value Date   CRP 2 03/29/2024   ESRSEDRATE 5 03/29/2024         Note: Above Lab results reviewed.  Recent Imaging Review  DG PAIN CLINIC C-ARM 1-60 MIN NO REPORT Fluoro was used, but no Radiologist interpretation will be provided.  Please refer to NOTES tab for provider progress note. Note: Reviewed  Physical Exam  Vitals: BP (!) 125/56   Pulse 75   Temp 97.7 F (36.5 C)   Resp 16   Ht 5' 8 (1.727 m)   Wt 156 lb (70.8 kg)   SpO2 100%   BMI 23.72 kg/m  BMI: Estimated body mass index is 23.72 kg/m as calculated from the following:   Height as of this encounter: 5' 8 (1.727 m).   Weight as of this encounter: 156 lb (70.8 kg). Ideal: Ideal body weight: 63.9 kg (140 lb 14 oz) Adjusted ideal body weight: 66.6 kg (146 lb 14.8 oz) General appearance: Well nourished, well developed, and well hydrated. In no apparent acute distress Mental status: Alert, oriented x 3 (person, place, & time)       Respiratory: No evidence of acute respiratory distress Eyes: PERLA   Assessment   Diagnosis Status  1. Chronic low back pain (1ry area of Pain) (Bilateral) w/o sciatica   2. Low back pain of over 3 months duration   3. Low back pain potentially associated with radiculopathy   4. Postop check   5. Nocturnal leg cramps    Controlled Controlled Controlled   Updated Problems: Problem   Nocturnal Leg Cramps    Plan of Care  Problem-specific:  Assessment and Plan    Lumbar radiculopathy with right hip and left leg pain   She experienced complete pain relief during the local anesthetic and ongoing 75% relief after a left-sided L2-3 lumbar steroid injection. Left leg pain is now intermittent, mainly in the evening, while right hip pain remains bothersome with an aching quality. Improvement is noted, but some discomfort persists. The possibility of hip pain originating from the hip joint was discussed. She prefers to wait 30 days before deciding on further intervention, indicating satisfaction with the current pain relief. Monitor symptoms for 30 days before deciding on further intervention.       Vickie [name] has a current medication list which includes the following long-term medication(s): gabapentin, levocetirizine, and montelukast.  Pharmacotherapy (Medications Ordered): No orders of the defined types were placed in this encounter.  Orders:  Orders Placed This Encounter  Procedures   Lumbar Epidural Injection    Standing Status:   Future    Expiration Date:   09/19/2024    Scheduling Instructions:     Procedure: Interlaminar Lumbar Epidural Steroid injection (LESI)  L2-3     Laterality: Left-sided     Sedation: None required.     Timeframe: PRN (patient will call)    Where will this procedure be performed?:   [facility]   Nursing Instructions:    Please complete this patient's postprocedure evaluation.    Scheduling Instructions:     Please complete this patient's postprocedure evaluation.     Interventional Therapies  Risk Factors  Considerations  Medical Comorbidities:     Planned  Pending:      Under consideration:   Therapeutic left L2-3 LESI #2    Completed:   Diagnostic/therapeutic left L2-3 LESI x1 (05/29/2024) (100/100/75/75)    Therapeutic  Palliative (PRN) options:   None established   Completed by other providers:    Diagnostic left L4-5 & L5-S1 TFESI x1 (07/12/2022) by Rockey JONELLE Pae, MD ([facility])  Diagnostic bilateral L4-5 & L5-S1 Facet MBB x2 (08/30/2022, 10/18/2022) by Rockey JONELLE Pae, MD ([facility])       Return if symptoms worsen or fail to improve, for (PRN), (Clinic): (L) L2-3 LESI #2.  Recent Visits Date Type Provider Dept  05/29/24 Procedure visit Tanya Glisson, MD [facility]  05/16/24 Office Visit Tanya Glisson, MD [facility]  03/29/24 Office Visit Tanya Glisson, MD [facility]  Showing recent visits within past 90 days and meeting all other requirements Today's Visits Date Type Provider Dept  06/19/24 Office Visit Tanya Glisson, MD [facility]  Showing today's visits and meeting all other requirements Future Appointments No visits were found meeting these conditions. Showing future appointments within next 90 days and meeting all other requirements  I discussed the assessment and treatment plan with the patient. The patient was provided an opportunity to ask questions and all were answered. The patient agreed with the plan and demonstrated an understanding of the instructions.  Patient advised to call back or seek an in-person evaluation if the symptoms or condition worsens.  Duration of encounter: 30 minutes.  Total time on encounter, as per AMA guidelines included both the face-to-face and non-face-to-face time personally spent by the physician and/or other qualified health care professional(s) on the day of the encounter (includes time in activities that require the physician or other qualified health care professional and does not include time in activities normally performed by clinical staff). Physician's time may include the following activities when performed: Preparing to see the patient (e.g., pre-charting review of records, searching for previously ordered imaging, lab work,  and nerve conduction tests) Review of prior analgesic pharmacotherapies. Reviewing PMP Interpreting ordered tests (e.g., lab work, imaging, nerve conduction tests) Performing post-procedure evaluations, including interpretation of diagnostic procedures Obtaining and/or reviewing separately obtained history Performing a medically appropriate examination and/or evaluation Counseling and educating the patient/family/caregiver Ordering medications, tests, or procedures Referring and communicating with other health care professionals (when not separately reported) Documenting clinical information in the electronic or other health record Independently interpreting results (not separately reported) and communicating results to the patient/ family/caregiver Care coordination (not separately reported)  Note by: Glisson DELENA Tanya, MD (TTS and AI technology used. I apologize for any typographical errors that were not detected and corrected.) Date: 06/19/2024; Time: 1:45 PM

## 2024-06-19 NOTE — Patient Instructions (Signed)
 ______________________________________________________________________    Muscle Spasms & Cramps  Cause(s):  Most common - vitamin and/or electrolyte (calcium, potassium, sodium, etc.) deficiencies. Post procedure - steroids (injected, oral, or inhaled) can make your kidneys excrete (loose) electrolytes. Most of the time this will not cause any symptoms however, if you happen to be borderline low on your electrolytes, it may temporarily triggering cramps & spasms.  Possible triggers: Sweating - causes loss of electrolytes thru the skin. Steroids - causes loss of electrolytes thru the urine.  Treatment: (over-the-counter)  Gatorade (or any other electrolyte-replenishing drink) - Take 1, 8 oz glass with each meal (3 times a day). Mechanism of action: Replenishes lost electrolytes. Magnesium 400 to 500 mg - Take 1 tablet twice a day (one with breakfast and one at bedtime). If you have kidney disease talk to your primary care physician before taking any Magnesium. Mechanism of action: Magnesium is a natural muscle relaxant. Tonic Water  with quinine - Take 1, 8 oz glass before bedtime.  Mechanism of action: Quinine is used to treat spasms.  Last Update: 05/26/2023  ______________________________________________________________________      ______________________________________________________________________    Procedure instructions  Stop blood-thinners  Do not eat or drink fluids (other than water ) for 6 hours before your procedure  No water  for 2 hours before your procedure  Take your blood pressure medicine with a sip of water   Arrive 30 minutes before your appointment  If sedation is planned, bring suitable driver. [place], [place], & public transportation are NOT APPROVED)  Carefully read the Preparing for your procedure detailed instructions  If you have questions call us  at [phone]  Procedure appointments are for procedures only.   NO medication refills or new  problem evaluations will be done on procedure days.   Only the scheduled, pre-approved procedure and side will be done.   ______________________________________________________________________      ______________________________________________________________________    Preparing for your procedure  Appointments: If you think you may not be able to keep your appointment, call 24-48 hours in advance to cancel. We need time to make it available to others.  Procedure visits are for procedures only. During your procedure appointment there will be: NO Prescription Refills*. NO medication changes or discussions*. NO discussion of disability issues*. NO unrelated pain problem evaluations*. NO evaluations to order other pain procedures*. *These will be addressed at a separate and distinct evaluation encounter on the provider's evaluation schedule and not during procedure days.  Instructions: Food intake: Avoid eating anything solid for at least 8 hours prior to your procedure. Clear liquid intake: You may take clear liquids such as water  up to 2 hours prior to your procedure. (No carbonated drinks. No soda.) Transportation: Unless otherwise stated by your physician, bring a driver. (Driver cannot be a Market researcher, Pharmacist, community, or any other form of public transportation.) Morning Medicines: Except for blood thinners, take all of your other morning medications with a sip of water . Make sure to take your heart and blood pressure medicines. If your blood pressure's lower number is above 100, the case will be rescheduled. Blood thinners: Make sure to stop your blood thinners as instructed.  If you take a blood thinner, but were not instructed to stop it, call our office [phone] and ask to talk to a nurse. Not stopping a blood thinner prior to certain procedures could lead to serious complications. Diabetics on insulin: Notify the staff so that you can be scheduled 1st case in the morning. If your diabetes  requires high  dose insulin, take only  of your normal insulin dose the morning of the procedure and notify the staff that you have done so. Preventing infections: Shower with an antibacterial soap the morning of your procedure.  Build-up your immune system: Take 1000 mg of Vitamin C with every meal (3 times a day) the day prior to your procedure. Antibiotics: Inform the nursing staff if you are taking any antibiotics or if you have any conditions that may require antibiotics prior to procedures. (Example: recent joint implants)   Pregnancy: If you are pregnant make sure to notify the nursing staff. Not doing so may result in injury to the fetus, including death.  Sickness: If you have a cold, fever, or any active infections, call and cancel or reschedule your procedure. Receiving steroids while having an infection may result in complications. Arrival: You must be in the facility at least 30 minutes prior to your scheduled procedure. Tardiness: Your scheduled time is also the cutoff time. If you do not arrive at least 15 minutes prior to your procedure, you will be rescheduled.  Children: Do not bring any children with you. Make arrangements to keep them home. Dress appropriately: There is always a possibility that your clothing may get soiled. Avoid long dresses. Valuables: Do not bring any jewelry or valuables.  Reasons to call and reschedule or cancel your procedure: (Following these recommendations will minimize the risk of a serious complication.) Surgeries: Avoid having procedures within 2 weeks of any surgery. (Avoid for 2 weeks before or after any surgery). Flu Shots: Avoid having procedures within 2 weeks of a flu shots or . (Avoid for 2 weeks before or after immunizations). Barium: Avoid having a procedure within 7-10 days after having had a radiological study involving the use of radiological contrast. (Myelograms, Barium swallow or enema study). Heart attacks: Avoid any elective  procedures or surgeries for the initial 6 months after a Myocardial Infarction (Heart Attack). Blood thinners: It is imperative that you stop these medications before procedures. Let us  know if you if you take any blood thinner.  Infection: Avoid procedures during or within two weeks of an infection (including chest colds or gastrointestinal problems). Symptoms associated with infections include: Localized redness, fever, chills, night sweats or profuse sweating, burning sensation when voiding, cough, congestion, stuffiness, runny nose, sore throat, diarrhea, nausea, vomiting, cold or Flu symptoms, recent or current infections. It is specially important if the infection is over the area that we intend to treat. Heart and lung problems: Symptoms that may suggest an active cardiopulmonary problem include: cough, chest pain, breathing difficulties or shortness of breath, dizziness, ankle swelling, uncontrolled high or unusually low blood pressure, and/or palpitations. If you are experiencing any of these symptoms, cancel your procedure and contact your primary care physician for an evaluation.  Remember:  Regular Business hours are:  Monday to Thursday 8:00 AM to 4:00 PM  Provider's Schedule: Eric Como, MD:  Procedure days: Tuesday and Thursday 7:30 AM to 4:00 PM  Wallie Sherry, MD:  Procedure days: Monday and Wednesday 7:30 AM to 4:00 PM Last  Updated: 10/25/2023 ______________________________________________________________________      ______________________________________________________________________    General Risks and Possible Complications  Patient Responsibilities: It is important that you read this as it is part of your informed consent. It is our duty to inform you of the risks and possible complications associated with treatments offered to you. It is your responsibility as a patient to read this and to ask questions about anything that  is not clear or that you believe  was not covered in this document.  Patient's Rights: You have the right to refuse treatment. You also have the right to change your mind, even after initially having agreed to have the treatment done. However, under this last option, if you wait until the last second to change your mind, you may be charged for the materials used up to that point.  Introduction: Medicine is not an Visual merchandiser. Everything in Medicine, including the lack of treatment(s), carries the potential for danger, harm, or loss (which is by definition: Risk). In Medicine, a complication is a secondary problem, condition, or disease that can aggravate an already existing one. All treatments carry the risk of possible complications. The fact that a side effects or complications occurs, does not imply that the treatment was conducted incorrectly. It must be clearly understood that these can happen even when everything is done following the highest safety standards.  No treatment: You can choose not to proceed with the proposed treatment alternative. The "PRO(s)" would include: avoiding the risk of complications associated with the therapy. The "CON(s)" would include: not getting any of the treatment benefits. These benefits fall under one of three categories: diagnostic; therapeutic; and/or palliative. Diagnostic benefits include: getting information which can ultimately lead to improvement of the disease or symptom(s). Therapeutic benefits are those associated with the successful treatment of the disease. Finally, palliative benefits are those related to the decrease of the primary symptoms, without necessarily curing the condition (example: decreasing the pain from a flare-up of a chronic condition, such as incurable terminal cancer).  General Risks and Complications: These are associated to most interventional treatments. They can occur alone, or in combination. They fall under one of the following six (6) categories: no benefit or  worsening of symptoms; bleeding; infection; nerve damage; allergic reactions; and/or death. No benefits or worsening of symptoms: In Medicine there are no guarantees, only probabilities. No healthcare provider can ever guarantee that a medical treatment will work, they can only state the probability that it may. Furthermore, there is always the possibility that the condition may worsen, either directly, or indirectly, as a consequence of the treatment. Bleeding: This is more common if the patient is taking a blood thinner, either prescription or over the counter (example: Goody Powders, Fish oil, Aspirin, Garlic, etc.), or if suffering a condition associated with impaired coagulation (example: Hemophilia, cirrhosis of the liver, low platelet counts, etc.). However, even if you do not have one on these, it can still happen. If you have any of these conditions, or take one of these drugs, make sure to notify your treating physician. Infection: This is more common in patients with a compromised immune system, either due to disease (example: diabetes, cancer, human immunodeficiency virus [HIV], etc.), or due to medications or treatments (example: therapies used to treat cancer and rheumatological diseases). However, even if you do not have one on these, it can still happen. If you have any of these conditions, or take one of these drugs, make sure to notify your treating physician. Nerve Damage: This is more common when the treatment is an invasive one, but it can also happen with the use of medications, such as those used in the treatment of cancer. The damage can occur to small secondary nerves, or to large primary ones, such as those in the spinal cord and brain. This damage may be temporary or permanent and it may lead to impairments that can range from temporary  numbness to permanent paralysis and/or brain death. Allergic Reactions: Any time a substance or material comes in contact with our body, there is the  possibility of an allergic reaction. These can range from a mild skin rash (contact dermatitis) to a severe systemic reaction (anaphylactic reaction), which can result in death. Death: In general, any medical intervention can result in death, most of the time due to an unforeseen complication. ______________________________________________________________________

## 2024-06-19 NOTE — Progress Notes (Signed)
 Safety precautions to be maintained throughout the outpatient stay will include: orient to surroundings, keep bed in low position, maintain call bell within reach at all times, provide assistance with transfer out of bed and ambulation.

## 2024-06-29 ENCOUNTER — Ambulatory Visit: Admitting: Nurse Practitioner

## 2024-07-29 ENCOUNTER — Ambulatory Visit
Admission: EM | Admit: 2024-07-29 | Discharge: 2024-07-29 | Disposition: A | Discharge status: Home / Self Care | Attending: Emergency Medicine | Admitting: Emergency Medicine

## 2024-07-29 ENCOUNTER — Encounter: Payer: Self-pay | Admitting: Emergency Medicine

## 2024-07-29 DIAGNOSIS — R21 Rash and other nonspecific skin eruption: Secondary | ICD-10-CM

## 2024-07-29 DIAGNOSIS — H1132 Conjunctival hemorrhage, left eye: Secondary | ICD-10-CM

## 2024-07-29 MED ORDER — PREDNISONE 10 MG (21) PO TBPK: ORAL_TABLET | Freq: Every day | ORAL | 0 refills | Status: AC

## 2024-07-29 NOTE — Discharge Instructions (Signed)
 Today you were evaluated for the redness to your eye based on the description of your symptoms and the examination is most consistent with a blood stick blood vessel which is not causing any harm to the body or the eye and should naturally improve in time, typically resolves in 1 to 2 weeks, more information in your packet  You do not need to complete any treatment in any form of eyedrops or oral medication for this to resolve  However if you begin to notice any new symptoms such as drainage, itching, pain or irritation please return to the urgent care for reevaluation or follow-up with your primary doctor  Your rash is most consistent with an inflammatory cause such as a contact rash, is not consistent with any signs of infection which typically causes heat to the skin, drainage and pain  Begin oral prednisone  every morning with food as directed to stop the inflammatory process, while taking avoid use of ibuprofen but may take Tylenol if needed  You may continue use of calamine lotion may also try oral allergy medicine and topical Benadryl cream to help with itching  Please be mindful that heat can make the skin feel more itchy and inflamed and if this occurs you just need to cool your skin back down  If symptoms continue to persist please follow-up for reevaluation

## 2024-07-29 NOTE — ED Provider Notes (Signed)
 Vickie [name]    CSN: [id number] Arrival date & time: 07/29/24  1133      History   Chief Complaint Chief Complaint  Patient presents with   Rash   Eye Problem    HPI Vickie [name] is a 58 y.o. female.   Patient presents for evaluation of redness noted to the left eye beginning 1 day ago.  Denies injury or trauma, drainage, pain or pruritus.  Has not attempted treatment.  Wears glasses but denies use of contacts.  Has not occurred before.    Patient concerned with erythematous pruritic rash present to the center of the chest beginning 3 days ago.  Denies changes in toiletries diet medication no recent travel, no sick contact has similar symptoms.  Has attempted use of calamine lotion which is somewhat helpful.  Past Medical History:  Diagnosis Date   Allergy 2020   Arthritis    Asthma childhood   BRCA negative 08/2021   MyRisk neg except TSC2 VUS   Family history of breast cancer    Genetic testing of female    pt had done in past, records request sent   Increased risk of breast cancer 08/2021   IBIS=22.6%/riskscore=20.9%    Patient Active Problem List   Diagnosis Date Noted   Nocturnal leg cramps 06/19/2024   Abnormal MRI, lumbar spine (05/06/2024) 05/16/2024   Chronic upper back pain (between shoulder blades) 05/09/2024   Chronic hip pain (Right) 03/29/2024   Radicular pain of lower extremity (Left) 03/29/2024   Low back pain of over 3 months duration 03/29/2024   Mechanical low back pain 03/29/2024   Multifactorial low back pain 03/29/2024   Low back pain potentially associated with radiculopathy 03/29/2024   Lumbar facet joint pain 03/29/2024   Lumbar facet joint syndrome 03/29/2024   Epistaxis 03/28/2024   Hypertrophy of nasal turbinates 03/28/2024   Other seasonal allergic rhinitis 03/28/2024   Polyp of nasal sinus 03/28/2024   Chronic pain syndrome 03/28/2024   Pharmacologic therapy 03/28/2024   Disorder of skeletal system 03/28/2024   Problems  influencing health status 03/28/2024   Annual physical exam 03/08/2024   Chronic low back pain (1ry area of Pain) (Bilateral) w/o sciatica 12/16/2023   Family history of breast cancer 12/16/2023   Hyperlipidemia 12/09/2023   Paresthesia of hand, bilateral 09/21/2022   Seasonal allergies 09/10/2022   Elevated LDL cholesterol level 09/10/2022   Levoscoliosis 06/23/2022   Lumbar radiculopathy 06/23/2022   Spondylosis without myelopathy or radiculopathy, 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   Vitamin D  deficiency 06/10/2022   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   Chronic lower extremity pain (Left) 04/15/2020   History of endometrial ablation 09/05/2017    Past Surgical History:  Procedure Laterality Date   ABLATION Bilateral 2007   BREAST BIOPSY Left    benign   BREAST BIOPSY Left    benign   BREAST BIOPSY Right 07/13/2021   u/s bx axilla hrdro coil shape 3 neg   BREAST BIOPSY Right 03/24/2022   mri bx/ neg   CESAREAN SECTION     COLONOSCOPY WITH PROPOFOL  N/A 01/06/2024   Procedure: COLONOSCOPY WITH PROPOFOL ;  Surgeon: Unk Corinn Skiff, MD;  [facility];  Service: Endoscopy;  Laterality: N/A;   TUBAL LIGATION      OB History     Gravida  1   Para  1   Term  1   Preterm      AB      Living  1  SAB      IAB      Ectopic      Multiple      Live Births               Home Medications    Prior to Admission medications   Medication Sig Start Date End Date Taking? Authorizing Provider  predniSONE  (STERAPRED UNI-PAK 21 TAB) 10 MG (21) TBPK tablet Take by mouth daily. Take 6 tabs by mouth daily  for 1 days, then 5 tabs for 1 days, then 4 tabs for 1 days, then 3 tabs for 1 days, 2 tabs for 1 days, then 1 tab by mouth daily for 1 days 07/29/24  Yes Teresa Price R, NP  Cholecalciferol 1.25 MG (50000 UT) capsule Take by mouth. 07/06/22   [provider]  gabapentin (NEURONTIN) 300 MG capsule Take 300 mg by mouth 3 (three) times daily.     [provider]  levocetirizine (XYZAL) 5 MG tablet Take 5 mg by mouth every evening.    [provider]  meloxicam  (MOBIC ) 7.5 MG tablet Take 1 tablet (7.5 mg total) by mouth daily. 02/02/24   Kaur, Charanpreet, NP  montelukast (SINGULAIR) 10 MG tablet Take by mouth. 09/10/22   [provider]  Omega-3 Fatty Acids (FISH OIL) 300 MG CAPS Take by mouth.    [provider]    Family History Family History  Problem Relation Age of Onset   Breast cancer Mother    Cancer Mother    Varicose Veins Mother    Breast cancer Sister    Asthma Sister    Prostate cancer Brother 63   Asthma Brother    Cancer Brother    Prostate cancer Brother 7   Breast cancer Maternal Grandmother    Breast cancer Other    Cancer Sister    Cancer Sister    Cancer Brother     Social History Social History   Tobacco Use   Smoking status: Former    Current packs/day: 0.00    Types: Cigarettes    Quit date: 02/14/2007    Years since quitting: 17.4   Smokeless tobacco: Never   Tobacco comments:    Smoked for 4 years (12/09/23)  Vaping Use   Vaping status: Never Used  Substance Use Topics   Alcohol use: Yes    Comment: occ   Drug use: Never     Allergies   Shellfish allergy   Review of Systems Review of Systems  Skin:  Positive for rash.     Physical Exam Triage Vital Signs ED Triage Vitals  Encounter Vitals Group     BP 07/29/24 1256 129/75     Girls Systolic BP Percentile --      Girls Diastolic BP Percentile --      Boys Systolic BP Percentile --      Boys Diastolic BP Percentile --      Pulse Rate 07/29/24 1256 71     Resp 07/29/24 1256 18     Temp 07/29/24 1256 98.5 F (36.9 C)     Temp Source 07/29/24 1256 Oral     SpO2 07/29/24 1256 99 %     Weight --      Height --      Head Circumference --      Peak Flow --      Pain Score 07/29/24 1302 0     Pain Loc --      Pain Education --  Exclude from Growth Chart --    No data  found.  Updated Vital Signs BP 129/75 (BP Location: Left Arm)   Pulse 71   Temp 98.5 F (36.9 C) (Oral)   Resp 18   SpO2 99%   Visual Acuity Right Eye Distance: 20/30 Left Eye Distance: 20/20 Bilateral Distance: 20/20  Right Eye Near:   Left Eye Near:    Bilateral Near:     Physical Exam Constitutional:      Appearance: Normal appearance.  Eyes:     Comments: Erythema to the left conjunctiva extending to the right upper corner of the eye, vision grossly intact, extraocular movements intact, no drainage noted  Pulmonary:     Effort: Pulmonary effort is normal.  Skin:    Comments: Erythematous fine papular rash present to the center of the chest wall extending to the anterior of the neck, no drainage noted, nontender, skin cool to touch  Neurological:     Mental Status: She is alert and oriented to person, place, and time.      UC Treatments / Results  Labs (all labs ordered are listed, but only abnormal results are displayed) Labs Reviewed - No data to display  EKG   Radiology No results found.  Procedures Procedures (including critical care time)  Medications Ordered in UC Medications - No data to display  Initial Impression / Assessment and Plan / UC Course  I have reviewed the triage vital signs and the nursing notes.  Pertinent labs & imaging results that were available during my care of the patient were reviewed by me and considered in my medical decision making (see chart for details).  Conjunctival hemorrhage of left eye, rash  Presentation of the consistent with a conjunctival hemorrhage, while patient endorses no injury or trauma, denies recent coughing and blood pressure remained stable at home endorses that she has been sneezing frequently due to allergies, possible trigger, given written handout of condition and timeline for possible resolution, advised to monitor  Presentation the rash most consistent with inflammatory process most likely  contact dermatitis however denies changes in external factors, presentation not consistent with infection, prescribed oral prednisone  and recommended over-the-counter medications and nonpharmacological supportive care for management of pruritus advised to follow-up if symptoms continue to persist Final Clinical Impressions(s) / UC Diagnoses   Final diagnoses:  Conjunctival hemorrhage of left eye  Rash and nonspecific skin eruption     Discharge Instructions      Today you were evaluated for the redness to your eye based on the description of your symptoms and the examination is most consistent with a blood stick blood vessel which is not causing any harm to the body or the eye and should naturally improve in time, typically resolves in 1 to 2 weeks, more information in your packet  You do not need to complete any treatment in any form of eyedrops or oral medication for this to resolve  However if you begin to notice any new symptoms such as drainage, itching, pain or irritation please return to the urgent care for reevaluation or follow-up with your primary doctor  Your rash is most consistent with an inflammatory cause such as a contact rash, is not consistent with any signs of infection which typically causes heat to the skin, drainage and pain  Begin oral prednisone  every morning with food as directed to stop the inflammatory process, while taking avoid use of ibuprofen but may take Tylenol if needed  You may continue use  of calamine lotion may also try oral allergy medicine and topical Benadryl cream to help with itching  Please be mindful that heat can make the skin feel more itchy and inflamed and if this occurs you just need to cool your skin back down  If symptoms continue to persist please follow-up for reevaluation   ED Prescriptions     Medication Sig Dispense Auth. Provider   predniSONE  (STERAPRED UNI-PAK 21 TAB) 10 MG (21) TBPK tablet Take by mouth daily. Take 6 tabs by  mouth daily  for 1 days, then 5 tabs for 1 days, then 4 tabs for 1 days, then 3 tabs for 1 days, 2 tabs for 1 days, then 1 tab by mouth daily for 1 days 21 tablet Inocencio Roy, Shelba SAUNDERS, NP      PDMP not reviewed this encounter.   Teresa Shelba SAUNDERS, NP 07/29/24 1322

## 2024-07-29 NOTE — ED Triage Notes (Signed)
 Patient reports she woke up yesterday morning with  redness to left eye. Patient also complains of red itchy rash to upper chest that started 2 days ago. Patient reports using Calamine lotion with no relief.

## 2024-11-21 ENCOUNTER — Other Ambulatory Visit: Payer: Self-pay | Admitting: Nurse Practitioner

## 2024-11-21 DIAGNOSIS — Z1231 Encounter for screening mammogram for malignant neoplasm of breast: Secondary | ICD-10-CM

## 2024-12-11 ENCOUNTER — Other Ambulatory Visit: Payer: Self-pay | Admitting: Medical Genetics

## 2024-12-11 ENCOUNTER — Ambulatory Visit
Admission: RE | Admit: 2024-12-11 | Discharge: 2024-12-11 | Disposition: A | Discharge status: Home / Self Care | Source: Ambulatory Visit | Attending: Nurse Practitioner | Admitting: Nurse Practitioner

## 2024-12-11 DIAGNOSIS — Z1231 Encounter for screening mammogram for malignant neoplasm of breast: Secondary | ICD-10-CM | POA: Diagnosis present

## 2024-12-12 ENCOUNTER — Ambulatory Visit: Payer: Self-pay | Admitting: Nurse Practitioner

## 2024-12-13 ENCOUNTER — Other Ambulatory Visit
Admission: RE | Admit: 2024-12-13 | Discharge: 2024-12-13 | Disposition: A | Discharge status: Home / Self Care | Payer: Self-pay | Source: Ambulatory Visit | Attending: Medical Genetics | Admitting: Medical Genetics
